# Patient Record
Sex: Female | Born: 1960 | Race: White | Hispanic: No | Marital: Married | Smoking: Former smoker
Health system: Southern US, Community
[De-identification: ages and names within clinical notes are randomized; demographics above are authoritative.]

## PROBLEM LIST (undated history)

## (undated) DIAGNOSIS — E7800 Pure hypercholesterolemia, unspecified: Secondary | ICD-10-CM

## (undated) DIAGNOSIS — E079 Disorder of thyroid, unspecified: Secondary | ICD-10-CM

## (undated) HISTORY — DX: Disorder of thyroid, unspecified: E07.9

## (undated) HISTORY — DX: Pure hypercholesterolemia, unspecified: E78.00

---

## 2016-04-22 ENCOUNTER — Ambulatory Visit (INDEPENDENT_AMBULATORY_CARE_PROVIDER_SITE_OTHER): Payer: BLUE CROSS/BLUE SHIELD | Admitting: Orthopaedic Surgery

## 2016-04-22 ENCOUNTER — Encounter (INDEPENDENT_AMBULATORY_CARE_PROVIDER_SITE_OTHER): Payer: Self-pay | Admitting: Orthopaedic Surgery

## 2016-04-22 ENCOUNTER — Ambulatory Visit (INDEPENDENT_AMBULATORY_CARE_PROVIDER_SITE_OTHER): Payer: Self-pay

## 2016-04-22 VITALS — BP 139/93 | HR 91 | Ht 67.0 in | Wt 215.0 lb

## 2016-04-22 DIAGNOSIS — M5442 Lumbago with sciatica, left side: Secondary | ICD-10-CM | POA: Diagnosis not present

## 2016-04-22 DIAGNOSIS — M5441 Lumbago with sciatica, right side: Secondary | ICD-10-CM

## 2016-04-22 NOTE — Progress Notes (Signed)
Office Visit Note   Patient: Kayla Esparza           Date of Birth: 02/07/1961           MRN: [id number] Visit Date: 04/22/2016              Requested by: Ardyth GalJane Ybanez, MD [address] [place], [place] [address] PCP: Ardyth GalYBANEZ,JANE, MD   Assessment & Plan: Visit Diagnoses:  1. Bilateral low back pain with bilateral sciatica, unspecified chronicity     Plan: We'll proceed with physical therapy I'll recheck her in a month. She has ongoing symptoms left consider diagnostic imaging. Her pain has gradually progressed over the years and is getting to the point where she can't sleep in a bed asleep on the sofa bothers her with traveling which is part of her husband's job.  Follow-Up Instructions: Return in about 1 month (around 05/23/2016).   Orders:  Orders Placed This Encounter  Procedures  . XR Lumbar Spine 2-3 Views   No orders of the defined types were placed in this encounter.     Procedures: No procedures performed   Clinical Data: No additional findings.   Subjective: Chief Complaint  Patient presents with  . Lower Back - Pain    Kayla Esparza is here for back pain which she has had for many years. Recently it has began to interfere with her daily activities. Sometimes she has pain is down front or on the side of legs. Unable to stand for long periods of time.  Patient has problems with that being in a car more than 2030 minutes. 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 She's been having to sleep on the couch can't sleep on the bed she has extreme sharp pain when she turns over that wakes her up. She's been using supratherapeutic dosages of ibuprofen which she knows is not good taking 2400 mg daily. She denies fever chills no well-balanced bladder symptoms.  Review of Systems  Constitutional: Negative for chills and diaphoresis.  HENT: Negative for ear discharge, ear pain and nosebleeds.   Eyes: Negative  for discharge and visual disturbance.  Respiratory: Negative for cough, choking and shortness of breath.   Cardiovascular: Negative for chest pain and palpitations.  Gastrointestinal: Negative for abdominal distention and abdominal pain.  Endocrine: Negative for cold intolerance and heat intolerance.  Genitourinary: Negative for flank pain and hematuria.  Musculoskeletal: Positive for back pain.       Chronic back pain off and on since age 55. Worse in the last several years. Bilateral knee pain with crepitus  Skin: Negative for rash and wound.  Neurological: Negative for seizures and speech difficulty.  Hematological: Negative for adenopathy. Does not bruise/bleed easily.  Psychiatric/Behavioral: Negative for agitation and suicidal ideas.     Objective: Vital Signs: BP (!) 139/93 (BP Location: Left Arm, Patient Position: Sitting)   Pulse 91   Ht 5\' 7"  (1.702 m)   Wt 215 lb (97.5 kg)   BMI 33.67 kg/m   Physical Exam  Constitutional: She is oriented to person, place, and time. She appears well-developed.  HENT:  Head: Normocephalic.  Right Ear: External ear normal.  Left Ear: External ear normal.  Eyes: Pupils are equal, round, and reactive to light.  Neck: No tracheal deviation present. No thyromegaly present.  Cardiovascular: Normal rate.   Pulmonary/Chest: Effort normal.  Abdominal: Soft.  Musculoskeletal:  Negative logroll the hips negative straight  leg raising 90. Bilateral knee crepitus. Full extension and flexion past 120. Collateral ligaments are stable no varus or valgus deformity. No synovitis upper extremities elbows fingers the wrist.  Neurological: She is alert and oriented to person, place, and time.  Skin: Skin is warm and dry.  Psychiatric: She has a normal mood and affect. Her behavior is normal.    Ortho Exam normal quad strength good hip flexion. Knee and ankle jerk are 2+ and symmetrical. Anterior tib EHL is strong with good resistance normal heel and toe  walking. There is bilateral sciatic notch tenderness bilateral trochanteric bursal region tenderness. Most of the pain radiates to the upper outer buttocks and into the trochanter. Rarely does she has pain that radiates past the the knees. A negative Pearlean BrownieFaber test  Specialty Comments:  No specialty comments available.  Imaging: Xr Lumbar Spine 2-3 Views  Result Date: 04/22/2016 Through 3 view x-rays lumbar spine obtained. Patient has no spondylolisthesis. A lateral x-ray there is narrowing of L5-S1. L4-5 space is normal there some narrowing at L2-3 with endplate changes and mild narrowing at L3-4. Hips SI joints are normal. No pars defects. 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    PMFS History: There are no active problems to display for this patient.  No past medical history on file.  No family history on file.  No past surgical history on file. Social History   Occupational History  . Not on file.   Social History Main Topics  . Smoking status: Former Games developermoker  . Smokeless tobacco: Never Used  . Alcohol use Not on file  . Drug use: Unknown  . Sexual activity: Not on file

## 2016-04-22 NOTE — Patient Instructions (Signed)
2. Do not take more than 1600 mg of ibuprofen per day. You can try some extra strength Tylenol 2 by mouth twice a day. Physical therapy ligament been ordered return in one month see Dr. Ophelia CharterYates

## 2016-05-11 ENCOUNTER — Ambulatory Visit: Payer: BLUE CROSS/BLUE SHIELD | Attending: Orthopaedic Surgery | Admitting: Physical Therapy

## 2016-05-11 DIAGNOSIS — G8929 Other chronic pain: Secondary | ICD-10-CM

## 2016-05-11 DIAGNOSIS — R293 Abnormal posture: Secondary | ICD-10-CM | POA: Insufficient documentation

## 2016-05-11 DIAGNOSIS — M5442 Lumbago with sciatica, left side: Secondary | ICD-10-CM | POA: Insufficient documentation

## 2016-05-11 DIAGNOSIS — M5441 Lumbago with sciatica, right side: Secondary | ICD-10-CM | POA: Insufficient documentation

## 2016-05-11 NOTE — Patient Instructions (Signed)
Pelvic Tilt: Posterior - Legs Bent (Supine)    Tighten stomach and flatten back by rolling pelvis down. Hold __5-10__ seconds. Relax. Repeat __15__ times per set.  Do __2-3__ sessions per day.    Single knee to chest - Double knee to chest  10-30 seconds - 5 times

## 2016-05-11 NOTE — Therapy (Signed)
[facility] [address]  [address] [place], [place], [address] Phone: [phone]   Fax:  [phone]  Physical Therapy Evaluation  Patient Details  Name: Kayla Esparza MRN: [id number] Date of Birth: 01-10-1961 Referring Provider: Dr. Annell Greening  Encounter Date: 05/11/2016      PT End of Session - 05/11/16 1749    Visit Number 1   Number of Visits 16   Date for PT Re-Evaluation 07/06/16   PT Start Time 1447   PT Stop Time 1540   PT Time Calculation (min) 53 min   Activity Tolerance Patient tolerated treatment well;Patient limited by pain   Behavior During Therapy [facility] for tasks assessed/performed      No past medical history on file.  No past surgical history on file.  There were no vitals filed for this visit.       Subjective Assessment - 05/11/16 1449    Subjective Patient reporting chronic low back pain - approx 20 years. Recently pain has been interferring with daily functions and sleeping. Has difficulty sleeping in one position as well as turning in bed. Patient reports she tends to push herself through pain. Patient does a good bit of physical activity with husbands work. Patient reports that she does not want to take pain medication and would like solve problem. Pronlonged standing and standing activities (sweeping, cooking, making a bed) seem to aggravate pain. Has been taking up to 2400 mg ibuprofen. Does not use ice/heat. Patient reports episodes of incontinence - bladder. Does have intermittent pain into B LE.    Limitations Sitting;Standing;Walking   How long can you sit comfortably? straight - at least 1 hour   How long can you stand comfortably? 10 minutes   How long can you walk comfortably? 30 minutes   Diagnostic tests xray - "narrowing discs"    Patient Stated Goals reduce back pain, avoid surgery, not take pain medication   Currently in Pain? Yes   Pain Score 0-No pain  average daily pain: 7-8/10    Pain Location Back   Pain Orientation Lower;Right;Left   Pain Descriptors / Indicators Burning   Pain Type Chronic pain   Pain Onset More than a month ago   Pain Frequency Intermittent   Aggravating Factors  prolonged standing, leaning back to sit   Pain Relieving Factors sitting (straight and on firm surface)            [facility] PT Assessment - 05/11/16 1458      Assessment   Medical Diagnosis Bilateral low back pain with bilateral sciatica   Referring Provider Dr. Annell Greening   Onset Date/Surgical Date --  many years ago   Next MD Visit prn   Prior Therapy no     Balance Screen   Has the patient fallen in the past 6 months No   Has the patient had a decrease in activity level because of a fear of falling?  No   Is the patient reluctant to leave their home because of a fear of falling?  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, travelling   Leisure travelling     Cognition   Overall Cognitive Status Within Functional Limits for tasks assessed  Observation/Other Assessments   Focus on Therapeutic Outcomes (FOTO)  Lumbar Spine: 37 (63% limited, predicted 47% limited)     Posture/Postural Control   Posture/Postural Control Postural limitations   Postural Limitations Rounded Shoulders;Forward head;Increased lumbar lordosis     ROM / Strength   AROM / PROM / Strength AROM;Strength     AROM   AROM Assessment Site Lumbar   Lumbar Flexion fingertip to anterior ankle - no pain   Lumbar Extension 75% limited - painful   Lumbar - Right Side Bend fingertip to jointline   Lumbar - Left Side Bend fincgertip to jointline - painful   Lumbar - Right Rotation 50% limited   Lumbar - Left Rotation 50% limited - painful     Strength   Strength Assessment  Site Hip;Knee   Right/Left Hip Right;Left   Right Hip Flexion 4/5   Right Hip Extension 3+/5   Right Hip ABduction 4-/5   Right Hip ADduction 4/5   Left Hip Flexion 4/5   Left Hip Extension 3/5   Left Hip ABduction 3+/5   Left Hip ADduction 4-/5   Right/Left Knee Right;Left   Right Knee Flexion 5/5   Right Knee Extension 5/5   Left Knee Flexion 5/5   Left Knee Extension 5/5     Flexibility   Soft Tissue Assessment /Muscle Length yes   Hamstrings B tightness - pain in L lumbar with B passive movements     Palpation   Palpation comment tenderness to palpation along B glute region, B lumbar paraspinals. Tender with light CPA of scarum and lumbar spinal segments     Special Tests    Special Tests Lumbar   Lumbar Tests Straight Leg Raise     Straight Leg Raise   Findings Negative   Side  --  bilateral                   [facility] Adult PT Treatment/Exercise - 05/11/16 1458      Exercises   Exercises Lumbar     Lumbar Exercises: Stretches   Single Knee to Chest Stretch 2 reps;10 seconds     Lumbar Exercises: Supine   Ab Set 5 reps;5 seconds     Modalities   Modalities Traction     Traction   Type of Traction Lumbar   Min (lbs) 110   Max (lbs) 120   Hold Time 60   Rest Time 20   Time 15                PT Education - 05/11/16 1748    Education provided Yes   Education Details exam findings, POC, HEP   Person(s) Educated Patient   Methods Explanation;Demonstration;Handout   Comprehension Verbalized understanding;Returned demonstration;Need further instruction             PT Long Term Goals - 05/11/16 1803      PT LONG TERM GOAL #1   Title patient to be independent with HEP (07/06/16)   Status New     PT LONG TERM GOAL #2   Title Patient to improve lumbar AROM to WNL in all directions with pain no greater than 2/10 (07/06/16)   Status New     PT LONG TERM GOAL #3   Title Patient to verbalize and demonstrate proper body mechanics for  reduced stress placed on low back for improved pain levels (07/06/16)   Status New     PT LONG TERM GOAL #4   Title Patient to demonstrate  proper lifting mechanics for reduced risk for injury to low back (07/06/16)   Status New     PT LONG TERM GOAL #5   Title Patient to report ability to roll in bed/perform transfers with pain no greater than 2/10 for greater than 2 weeks (07/06/16)   Status New     Additional Long Term Goals   Additional Long Term Goals Yes     PT LONG TERM GOAL #6   Title patient to improve B hip strength to >/= 4+/5 (07/06/16)   Status New               Plan - 05/11/16 1749    Clinical Impression Statement Patient is a 56 y/o female presenting to OPPT today for moderate complexity evaluation with patients primary complaints regarding chronic low back pain with intermittent pain radiation into B LE. Patient today with no pain while sitting, however pain exacerbated while lying supine as well as supine to sit transfer with patient becoming tearful due to pain. Patient with slight decreased in B hip strength with abduction and hip extension demonstrating the most deficits, tenderness to palpation along glute complex and lumbar paraspinals, pain with lumbar extension, left lateral flexion and rotation as well as reduced core engagement likely increasing stresses placed on lumbar structures. Discussion with patient regarding plan of care, goal establishment as well as HEP with patient in full agreement to treatment plan. Lumbar traction initiated today with max level between 50-60% of patients body weight with patient reporting pain levels of 1/10 during treatement; which may have been caused from supine position, however pain slightly increased when traction forces were reduced to 0 lbs and pain exacerbated when sitting up from treatment table. Patient to benefit from PT to address the above listed deficits to maximize function and improve quality of life.    Rehab Potential  Good   PT Frequency 2x / week   PT Duration 8 weeks   PT Treatment/Interventions ADLs/Self Care Home Management;Cryotherapy;Electrical Stimulation;Moist Heat;Traction;Ultrasound;Neuromuscular re-education;Therapeutic exercise;Therapeutic activities;Functional mobility training;Patient/family education;Manual techniques;Passive range of motion;Taping;Dry needling   PT Next Visit Plan core stabilization; modalites PRN for pain   Consulted and Agree with Plan of Care Patient      Patient will benefit from skilled therapeutic intervention in order to improve the following deficits and impairments:  Pain, Decreased activity tolerance, Decreased range of motion, Decreased mobility, Decreased strength, Increased muscle spasms  Visit Diagnosis: Chronic bilateral low back pain with bilateral sciatica - Plan: PT plan of care cert/re-cert  Abnormal posture - Plan: PT plan of care cert/re-cert     Problem List There are no active problems to display for this patient.   Kipp Laurence, PT, DPT 05/11/16 6:09 PM   [facility] [address]  [address] [place], [place], [address] Phone: [phone]   Fax:  [phone]  Name: Kayla Esparza MRN: [id number] Date of Birth: November 17, 1960

## 2016-05-12 ENCOUNTER — Ambulatory Visit: Payer: BLUE CROSS/BLUE SHIELD | Admitting: Physical Therapy

## 2016-05-12 DIAGNOSIS — R293 Abnormal posture: Secondary | ICD-10-CM

## 2016-05-12 DIAGNOSIS — G8929 Other chronic pain: Secondary | ICD-10-CM

## 2016-05-12 DIAGNOSIS — M5442 Lumbago with sciatica, left side: Principal | ICD-10-CM

## 2016-05-12 DIAGNOSIS — M5441 Lumbago with sciatica, right side: Principal | ICD-10-CM

## 2016-05-12 NOTE — Patient Instructions (Signed)
Piriformis Stretch    Lying on back, pull right knee toward opposite shoulder. Hold __30__ seconds. Repeat __3__ times. Do __2__ sessions per day.  Hamstring Step 2    Left foot relaxed, knee straight, other leg bent, foot flat. Raise straight leg further upward to maximal range. Hold _30__ seconds. Relax leg completely down. Repeat _3__ times.

## 2016-05-12 NOTE — Therapy (Signed)
[facility] [place] [address]  [address] [place], [place], [address] Phone: [phone]   Fax:  [phone]  Physical Therapy Treatment  Patient Details  Name: Kayla Esparza MRN: [id number] Date of Birth: [birth year] Referring Provider: Dr. Annell GreeningMark Yates  Encounter Date: 05/12/2016      PT End of Session - 05/12/16 0844    Visit Number 2   Number of Visits 16   Date for PT Re-Evaluation 07/06/16   PT Start Time 0842   PT Stop Time 0928   PT Time Calculation (min) 46 min   Activity Tolerance Patient tolerated treatment well;Patient limited by pain   Behavior During Therapy [facility] for tasks assessed/performed      No past medical history on file.  No past surgical history on file.  There were no vitals filed for this visit.      Subjective Assessment - 05/12/16 0843    Subjective tried log rolling to get out of bed this moring - had a little less pain with this.    Diagnostic tests xray - "narrowing discs"    Patient Stated Goals reduce back pain, avoid surgery, not take pain medication   Currently in Pain? No/denies   Pain Score 0-No pain  7/10 when first got up this moring   Pain Location Back   Pain Orientation Right;Left;Lower   Pain Type Chronic pain   Pain Onset More than a month ago   Pain Frequency Intermittent            [facility] PT Assessment - 05/11/16 1458      Assessment   Medical Diagnosis Bilateral low back pain with bilateral sciatica   Referring Provider Dr. Annell GreeningMark Yates   Onset Date/Surgical Date --  many years ago   Next MD Visit prn   Prior Therapy no     Balance Screen   Has the patient fallen in the past 6 months No   Has the patient had a decrease in activity level because of a fear of falling?  No   Is the patient reluctant to leave their home because of a fear of falling?  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, travelling   Leisure travelling     Cognition   Overall Cognitive Status Within Functional Limits for tasks assessed     Observation/Other Assessments   Focus on Therapeutic Outcomes (FOTO)  Lumbar Spine: 37 (63% limited, predicted 47% limited)     Posture/Postural Control   Posture/Postural Control Postural limitations   Postural Limitations Rounded Shoulders;Forward head;Increased lumbar lordosis     ROM / Strength   AROM / PROM / Strength AROM;Strength     AROM   AROM Assessment Site Lumbar   Lumbar Flexion fingertip to anterior ankle - no pain   Lumbar Extension 75% limited - painful   Lumbar - Right Side Bend fingertip to jointline   Lumbar - Left Side Bend fincgertip to jointline - painful   Lumbar - Right Rotation 50% limited   Lumbar - Left Rotation 50% limited - painful     Strength   Strength Assessment Site Hip;Knee   Right/Left Hip Right;Left   Right Hip Flexion 4/5   Right Hip Extension 3+/5   Right  Hip ABduction 4-/5   Right Hip ADduction 4/5   Left Hip Flexion 4/5   Left Hip Extension 3/5   Left Hip ABduction 3+/5   Left Hip ADduction 4-/5   Right/Left Knee Right;Left   Right Knee Flexion 5/5   Right Knee Extension 5/5   Left Knee Flexion 5/5   Left Knee Extension 5/5     Flexibility   Soft Tissue Assessment /Muscle Length yes   Hamstrings B tightness - pain in L lumbar with B passive movements     Palpation   Palpation comment tenderness to palpation along B glute region, B lumbar paraspinals. Tender with light CPA of scarum and lumbar spinal segments     Special Tests    Special Tests Lumbar   Lumbar Tests Straight Leg Raise     Straight Leg Raise   Findings Negative   Side  --  bilateral                     [facility] Adult PT Treatment/Exercise - 05/12/16 0001      Lumbar Exercises:  Stretches   Passive Hamstring Stretch 3 reps;30 seconds   Passive Hamstring Stretch Limitations B LE   Single Knee to Chest Stretch Limitations HEP review   Quadruped Mid Back Stretch Limitations seated on mat table - red physioball for lumbar flexion/right side stretch/left side stretch - 5 reps each direction for 5 second hold   ITB Stretch 2 reps;20 seconds   ITB Stretch Limitations attempted - terminated due to medial groin pain   Piriformis Stretch 3 reps;30 seconds   Piriformis Stretch Limitations B LE      Lumbar Exercises: Aerobic   Stationary Bike Nustep Level 5 x 5 minutes     Lumbar Exercises: Supine   Ab Set 15 reps;5 seconds   Clam 10 reps   Clam Limitations B LE - ab set prior to movement   Bridge 15 reps   Straight Leg Raise 15 reps   Straight Leg Raises Limitations B LE                 PT Education - 05/11/16 1748    Education provided Yes   Education Details exam findings, POC, HEP   Person(s) Educated Patient   Methods Explanation;Demonstration;Handout   Comprehension Verbalized understanding;Returned demonstration;Need further instruction             PT Long Term Goals - 05/11/16 1803      PT LONG TERM GOAL #1   Title patient to be independent with HEP (07/06/16)   Status New     PT LONG TERM GOAL #2   Title Patient to improve lumbar AROM to WNL in all directions with pain no greater than 2/10 (07/06/16)   Status New     PT LONG TERM GOAL #3   Title Patient to verbalize and demonstrate proper body mechanics for reduced stress placed on low back for improved pain levels (07/06/16)   Status New     PT LONG TERM GOAL #4   Title Patient to demonstrate proper lifting mechanics for reduced risk for injury to low back (07/06/16)   Status New     PT LONG TERM GOAL #5   Title Patient to report ability to roll in bed/perform transfers with pain no greater than 2/10 for greater than 2 weeks (07/06/16)   Status New     Additional Long Term Goals    Additional Long Term Goals Yes  PT LONG TERM GOAL #6   Title patient to improve B hip strength to >/= 4+/5 (07/06/16)   Status New               Plan - 05/12/16 0844    Clinical Impression Statement Patient doing well today - no pain at beginning of session, however awoke with pain up to 7/10 this morning. Did try log rolling to get out of bed this morning with reduced pain as compared to normal. Review of log rolling techniques in session today from both right and left sides to reinforce correct transfer technique with very little pain experience during task. Core stabilization reviewed and completed with some difficulty with this as patient tends to use B LE rather than core musculature - some pain experienced when coming out of posterior pelvic tilt posture. Gentle strengthening and stretching of hips initiated today with good form. Patient to continue to benefit from PT to maximize function.    PT Treatment/Interventions ADLs/Self Care Home Management;Cryotherapy;Electrical Stimulation;Moist Heat;Traction;Ultrasound;Neuromuscular re-education;Therapeutic exercise;Therapeutic activities;Functional mobility training;Patient/family education;Manual techniques;Passive range of motion;Taping;Dry needling   PT Next Visit Plan core stabilization; modalites PRN for pain   Consulted and Agree with Plan of Care Patient      Patient will benefit from skilled therapeutic intervention in order to improve the following deficits and impairments:  Pain, Decreased activity tolerance, Decreased range of motion, Decreased mobility, Decreased strength, Increased muscle spasms  Visit Diagnosis: Chronic bilateral low back pain with bilateral sciatica  Abnormal posture     Problem List There are no active problems to display for this patient.   Kipp Laurence, PT, DPT 05/12/16 11:17 AM   [facility] [address]  [address] [place], [place], [address] Phone: [phone]   Fax:  [phone]  Name: Kayla Esparza MRN: [id number] Date of Birth: 1960/12/08

## 2016-05-13 ENCOUNTER — Ambulatory Visit: Payer: Self-pay | Admitting: Physical Therapy

## 2016-05-20 ENCOUNTER — Ambulatory Visit: Payer: BLUE CROSS/BLUE SHIELD | Admitting: Physical Therapy

## 2016-05-24 ENCOUNTER — Ambulatory Visit: Payer: BLUE CROSS/BLUE SHIELD

## 2016-05-24 DIAGNOSIS — M5442 Lumbago with sciatica, left side: Secondary | ICD-10-CM | POA: Diagnosis not present

## 2016-05-24 DIAGNOSIS — M5441 Lumbago with sciatica, right side: Principal | ICD-10-CM

## 2016-05-24 DIAGNOSIS — R293 Abnormal posture: Secondary | ICD-10-CM

## 2016-05-24 DIAGNOSIS — G8929 Other chronic pain: Secondary | ICD-10-CM

## 2016-05-24 NOTE — Therapy (Addendum)
[facility] [address]  [place], [place], [address] Phone: [phone]   Fax:  [phone]  Physical Therapy Treatment  Patient Details  Name: Kayla Esparza MRN: [id number] Date of Birth: October 31, 1960 Referring Provider: Dr. Rodell Perna  Encounter Date: 05/24/2016      PT End of Session - 05/24/16 1315    Visit Number 3   Number of Visits 16   Date for PT Re-Evaluation 07/06/16   PT Start Time 1311   PT Stop Time 1415   PT Time Calculation (min) 64 min   Activity Tolerance Patient tolerated treatment well;Patient limited by pain   Behavior During Therapy [facility] for tasks assessed/performed      No past medical history on file.  No past surgical history on file.  There were no vitals filed for this visit.      Subjective Assessment - 05/24/16 1313    Subjective Pt. reporting LBP is mostly R sided today.  Pt. reporting nothing makes the pain better at this point.     Patient Stated Goals reduce back pain, avoid surgery, not take pain medication   Pain Score 5    Pain Location Back   Pain Orientation Right;Lower   Pain Descriptors / Indicators Shooting   Pain Type Chronic pain   Pain Onset More than a month ago   Multiple Pain Sites No            [facility] Adult PT Treatment/Exercise - 05/24/16 1325      Lumbar Exercises: Stretches   Passive Hamstring Stretch 3 reps;30 seconds   Passive Hamstring Stretch Limitations B LE   Single Knee to Chest Stretch 2 reps;30 seconds   Single Knee to Chest Stretch Limitations SKTC stretch 2 x 30 sec    Piriformis Stretch 3 reps;30 seconds   Piriformis Stretch Limitations B LE      Lumbar Exercises: Aerobic   Stationary Bike Nustep Level 5 x 6 minutes     Lumbar Exercises: Machines for Strengthening   Other Lumbar Machine Exercise BATCA low row 15# x 15 reps  cues provided for upright posture     Lumbar Exercises: Standing   Other Standing Lumbar Exercises  Functional squat with TRX x 10 reps; cues provided for upright posture   Other Standing Lumbar Exercises Functional squat x 10 reps on counter     Lumbar Exercises: Supine   Ab Set 15 reps;5 seconds  2 sets    Clam 10 reps   Clam Limitations abdom. brace with alternating hip/ER with green TB each side   Bent Knee Raise 10 reps;2 seconds   Bent Knee Raise Limitations with abdominal brace. with green TB    Bridge 15 reps;2 seconds  2 sets    Other Supine Lumbar Exercises Supine HS curl with heels on peanut p-ball x 10 reps   Other Supine Lumbar Exercises Supine SL bridge with heels on peanut p-ball 2" x 10 reps     Lumbar Exercises: Sidelying   Clam 10 reps;3 seconds   Clam Limitations Bilaterally; with green TB around knees     Modalities   Modalities Traction     Traction   Type of Traction Lumbar   Min (lbs) 65   Max (lbs) 50   Hold Time 60   Rest Time 20   Time 15            PT Long Term Goals - 05/24/16 1316  PT LONG TERM GOAL #1   Title patient to be independent with HEP (07/06/16)   Status On-going     PT LONG TERM GOAL #2   Title Patient to improve lumbar AROM to WNL in all directions with pain no greater than 2/10 (07/06/16)   Status On-going     PT LONG TERM GOAL #3   Title Patient to verbalize and demonstrate proper body mechanics for reduced stress placed on low back for improved pain levels (07/06/16)   Status On-going     PT LONG TERM GOAL #4   Title Patient to demonstrate proper lifting mechanics for reduced risk for injury to low back (07/06/16)   Status On-going     PT LONG TERM GOAL #5   Title Patient to report ability to roll in bed/perform transfers with pain no greater than 2/10 for greater than 2 weeks (07/06/16)   Status On-going     PT LONG TERM GOAL #6   Title patient to improve B hip strength to >/= 4+/5 (07/06/16)   Status On-going               Plan - 05/24/16 1317    Clinical Impression Statement Pt. reporting she feels  that therapy is not helping and she has an MD f/u tomorrow where she anticipates being cleared to schedule an MRI.  Today's treatment focused on lumbopelvic strengthening and stretching to pt. tolerance.  Pt. LBP decreasing to 0/10 following supine therex however rising to 5/10 following lumbar traction today.  Conservative setting performed with lumbar traction 65#/50# in hooklying, neutral pull, however pt. with increased pain with transitional movements following traction.  Pt. instructed to inform therapist next treatment of ongoing response to traction.   PT Treatment/Interventions ADLs/Self Care Home Management;Cryotherapy;Electrical Stimulation;Moist Heat;Traction;Ultrasound;Neuromuscular re-education;Therapeutic exercise;Therapeutic activities;Functional mobility training;Patient/family education;Manual techniques;Passive range of motion;Taping;Dry needling   PT Next Visit Plan core stabilization; modalites PRN for pain      Patient will benefit from skilled therapeutic intervention in order to improve the following deficits and impairments:  Pain, Decreased activity tolerance, Decreased range of motion, Decreased mobility, Decreased strength, Increased muscle spasms  Visit Diagnosis: Chronic bilateral low back pain with bilateral sciatica  Abnormal posture     Problem List There are no active problems to display for this patient.   Bess Harvest, PTA 05/24/16 6:27 PM    PHYSICAL THERAPY DISCHARGE SUMMARY  Visits from Start of Care: 3  Current functional level related to goals / functional outcomes: See above   Remaining deficits: See above; pain, reduced activity tolerance   Education / Equipment: HEP  Plan: Patient agrees to discharge.  Patient goals were not met. Patient is being discharged due to not returning since the last visit.  ?????    Patient not returning since last visit. Did follow-up with MD with MRI completed per chart review.   Lanney Gins, PT,  DPT 07/15/16 4:15 PM   [facility] [address]  [place], [place], [address] Phone: [phone]   Fax:  [phone]  Name: Kayla Esparza MRN: [id number] Date of Birth: [birth year]

## 2016-05-25 ENCOUNTER — Ambulatory Visit (INDEPENDENT_AMBULATORY_CARE_PROVIDER_SITE_OTHER): Payer: BLUE CROSS/BLUE SHIELD | Admitting: Orthopaedic Surgery

## 2016-05-25 ENCOUNTER — Encounter (INDEPENDENT_AMBULATORY_CARE_PROVIDER_SITE_OTHER): Payer: Self-pay | Admitting: Orthopaedic Surgery

## 2016-05-25 VITALS — BP 140/90 | HR 77 | Ht 67.0 in | Wt 215.0 lb

## 2016-05-25 DIAGNOSIS — G8929 Other chronic pain: Secondary | ICD-10-CM

## 2016-05-25 DIAGNOSIS — M545 Low back pain, unspecified: Secondary | ICD-10-CM

## 2016-05-25 NOTE — Addendum Note (Signed)
Addended by: Rogers SeedsYEATTS, Arieona Swaggerty M on: 05/25/2016 09:24 AM   Modules accepted: Orders

## 2016-05-25 NOTE — Progress Notes (Signed)
Office Visit Note   Patient: Kayla Esparza           Date of Birth: [birth year]           MRN: [id number] Visit Date: 05/25/2016              Requested by: Ardyth GalJane Ybanez, MD [address] [place], [place] [address] PCP: Ardyth GalYBANEZ,JANE, MD   Assessment & Plan: Visit Diagnoses:  1. Chronic bilateral low back pain without sciatica     Plan: We'll proceed with a lumbar MRI scan imaging. She's not gotten any relief physical therapy. She thinks the therapy may have actually made things worse she has pain on a daily basis she wakes up at 4 the morning sometimes can go back to sleep just get up and walk around she has pain when she turns over pain with bending and twisting pain radiates from her back into her left leg down to her knee. She's actually gotten a little worse with therapy will obtain an MRI scan. Plain radiograph showed several levels that showed disc space narrowing with some facet changes. Office follow-up after MRI scan for review. Follow-Up Instructions: No Follow-up on file.   Orders:  No orders of the defined types were placed in this encounter.  No orders of the defined types were placed in this encounter.     Procedures: No procedures performed   Clinical Data: No additional findings.   Subjective: Chief Complaint  Patient presents with  . Lower Back - Pain    Patient returns for one month follow up low back pain. She has been going to physical therapy but feels that is making her back worse. She has fallen after getting up since her last visit due to the pain that she has in her back.  She does have radiculopathy on the left. The pain runs down the lateral side of her left leg to just below her knee. She also has some radiculopathy on the right, stating the pain goes down the front of her right leg. She denies taking anything for pain.    Review of Systems  Constitutional: Negative for chills and diaphoresis.  HENT: Negative for ear discharge, ear  pain and nosebleeds.   Eyes: Negative for discharge and visual disturbance.  Respiratory: Negative for cough, choking and shortness of breath.   Cardiovascular: Negative for chest pain and palpitations.  Gastrointestinal: Negative for abdominal distention and abdominal pain.  Endocrine: Negative for cold intolerance and heat intolerance.  Genitourinary: Negative for flank pain and hematuria.  Musculoskeletal:       Chronic back pain since age 56.  Skin: Negative for rash and wound.  Neurological: Negative for seizures and speech difficulty.  Hematological: Negative for adenopathy. Does not bruise/bleed easily.  Psychiatric/Behavioral: Negative for agitation and suicidal ideas.     Objective: Vital Signs: BP 140/90   Pulse 77   Ht 5\' 7"  (1.702 m)   Wt 215 lb (97.5 kg)   BMI 33.67 kg/m   Physical Exam  Constitutional: She is oriented to person, place, and time. She appears well-developed.  HENT:  Head: Normocephalic.  Right Ear: External ear normal.  Left Ear: External ear normal.  Eyes: Pupils are equal, round, and reactive to light.  Neck: No tracheal deviation present. No thyromegaly present.  Cardiovascular: Normal rate.   Pulmonary/Chest: Effort normal.  Abdominal: Soft.  Musculoskeletal:  Patient's able heel and toe walk she has difficulty getting in a fully erect position tends to  walk with the hip slightly flexed forward. Anterior tib EHL is intact no pitting edema distal pulses are intact no pain with hip range of motion. She has tenderness of the greater trochanter sciatic notch tenderness worse on the left than right. Pain radiates from her back into the buttocks stop some of the knee. No venous stasis changes. Strength ankle dorsiflexion plantar flexion is normal good quad strength good symmetry.  Neurological: She is alert and oriented to person, place, and time.  Skin: Skin is warm and dry.  Psychiatric: She has a normal mood and affect. Her behavior is normal.     Ortho Exam  Specialty Comments:  No specialty comments available.  Imaging: No results found.   PMFS History: There are no active problems to display for this patient.  No past medical history on file.  No family history on file.  No past surgical history on file. Social History   Occupational History  . Not on file.   Social History Main Topics  . Smoking status: Former Games developer  . Smokeless tobacco: Never Used  . Alcohol use Not on file  . Drug use: Unknown  . Sexual activity: Not on file

## 2016-05-26 ENCOUNTER — Ambulatory Visit: Payer: BLUE CROSS/BLUE SHIELD | Admitting: Physical Therapy

## 2016-06-01 ENCOUNTER — Ambulatory Visit: Payer: BLUE CROSS/BLUE SHIELD

## 2016-06-03 ENCOUNTER — Ambulatory Visit: Payer: BLUE CROSS/BLUE SHIELD

## 2016-06-08 ENCOUNTER — Ambulatory Visit: Payer: BLUE CROSS/BLUE SHIELD | Admitting: Physical Therapy

## 2016-06-10 ENCOUNTER — Ambulatory Visit: Payer: BLUE CROSS/BLUE SHIELD | Admitting: Physical Therapy

## 2016-06-10 ENCOUNTER — Ambulatory Visit
Admission: RE | Admit: 2016-06-10 | Discharge: 2016-06-10 | Disposition: A | Discharge status: Home / Self Care | Payer: BLUE CROSS/BLUE SHIELD | Source: Ambulatory Visit | Attending: Orthopaedic Surgery | Admitting: Orthopaedic Surgery

## 2016-06-10 DIAGNOSIS — M545 Low back pain, unspecified: Secondary | ICD-10-CM

## 2016-06-10 DIAGNOSIS — G8929 Other chronic pain: Secondary | ICD-10-CM

## 2016-06-14 ENCOUNTER — Ambulatory Visit (INDEPENDENT_AMBULATORY_CARE_PROVIDER_SITE_OTHER): Payer: BLUE CROSS/BLUE SHIELD | Admitting: Orthopaedic Surgery

## 2016-06-16 ENCOUNTER — Ambulatory Visit (INDEPENDENT_AMBULATORY_CARE_PROVIDER_SITE_OTHER): Payer: BLUE CROSS/BLUE SHIELD | Admitting: Orthopaedic Surgery

## 2016-07-23 ENCOUNTER — Ambulatory Visit (INDEPENDENT_AMBULATORY_CARE_PROVIDER_SITE_OTHER): Payer: BLUE CROSS/BLUE SHIELD | Admitting: Orthopaedic Surgery

## 2016-07-23 ENCOUNTER — Encounter (INDEPENDENT_AMBULATORY_CARE_PROVIDER_SITE_OTHER): Payer: Self-pay | Admitting: Orthopaedic Surgery

## 2016-07-23 VITALS — BP 139/95 | HR 95 | Ht 67.0 in | Wt 215.0 lb

## 2016-07-23 DIAGNOSIS — M5136 Other intervertebral disc degeneration, lumbar region: Secondary | ICD-10-CM | POA: Diagnosis not present

## 2016-07-23 NOTE — Progress Notes (Signed)
Office Visit Note   Patient: Kayla Esparza           Date of Birth: [birth year]           MRN: [id number] Visit Date: 07/23/2016              Requested by: Ardyth GalJane Ybanez, MD [address] [place], [place] [address] PCP: Ardyth GalYBANEZ,JANE, MD   Assessment & Plan: Visit Diagnoses:  1. Other intervertebral disc degeneration, lumbar region     Plan: MRI scan shows facet degenerative changes as well as some endplate edema at U9-N25-S1. Lumbar facet changes at L4-5 as well as L5-S1. We will surface of physical therapy and recheck her back again in 4 weeks. Reviewed the MRI images I gave her a copy of the report.   Follow-Up Instructions: Return in about 4 weeks (around 08/20/2016).   Orders:  Orders Placed This Encounter  Procedures  . Ambulatory referral to Physical Medicine Rehab   No orders of the defined types were placed in this encounter.     Procedures: No procedures performed   Clinical Data: No additional findings.   Subjective: Chief Complaint  Patient presents with  . Lower Back - Follow-up    Patient returns today for follow up to review MRI lumbar spine.   Space been through physical therapy without relief. Pain wakes her up at night she has difficulty going back to sleep. Pain radiates down for back and her left leg stops near the knee. He seemed to evaluate her symptoms. I scan lumbar is been performed and is available for review. She states she's had pain since age 56 chronically.  Review of Systems  Constitutional: Negative for chills and diaphoresis.  HENT: Negative for ear discharge, ear pain and nosebleeds.   Eyes: Negative for discharge and visual disturbance.  Respiratory: Negative for cough, choking and shortness of breath.   Cardiovascular: Negative for chest pain and palpitations.  Gastrointestinal: Negative for abdominal distention and abdominal pain.  Endocrine: Negative for cold intolerance and heat intolerance.  Genitourinary: Negative for  flank pain and hematuria.  Musculoskeletal:       Positive chronic back pain since age 56. Negative for rheumatologic condition. She denies fever or chills.  Skin: Negative for rash and wound.  Neurological: Negative for seizures and speech difficulty.  Hematological: Negative for adenopathy. Does not bruise/bleed easily.  Psychiatric/Behavioral: Negative for agitation and suicidal ideas.     Objective: Vital Signs: BP (!) 139/95 (BP Location: Left Arm, Patient Position: Sitting)   Pulse 95   Ht 5\' 7"  (1.702 m)   Wt 215 lb (97.5 kg)   BMI 33.67 kg/m   Physical Exam  Constitutional: She is oriented to person, place, and time. She appears well-developed.  HENT:  Head: Normocephalic.  Right Ear: External ear normal.  Left Ear: External ear normal.  Eyes: Pupils are equal, round, and reactive to light.  Neck: No tracheal deviation present. No thyromegaly present.  Cardiovascular: Normal rate.   Pulmonary/Chest: Effort normal.  Abdominal: Soft.  Musculoskeletal:  Patient has some palpable tenderness lumbosacral spine more tenderness left lumbar region than right pain with rotation and lateral bending as well as for flexion. Extremity reflexes are 2+. No SI motor weakness anterior tib gastrocsoleus, EHL, quads or hamstrings. Normal internal/external rotation both hips without pain. Distal pulses are intact. Slight decreased sensation in her feet from the ankles down no single dermatome sensory deficit. Knees have full range of motion good stability.  Neurological:  She is alert and oriented to person, place, and time.  Skin: Skin is warm and dry.  Psychiatric: She has a normal mood and affect. Her behavior is normal.    Ortho Exam  Specialty Comments:  No specialty comments available.  Imaging: Show images for MR Lumbar Spine w/o contrast  Study Result   CLINICAL DATA:  Low back pain.  Bilateral hip and leg pain.  EXAM: MRI LUMBAR SPINE WITHOUT  CONTRAST  TECHNIQUE: Multiplanar, multisequence MR imaging of the lumbar spine was performed. No intravenous contrast was administered.  COMPARISON:  04/22/2016  FINDINGS: Segmentation: The lowest lumbar type non-rib-bearing vertebra is labeled as L5.  Alignment:  No vertebral subluxation is observed.  Vertebrae: Type 1 degenerative endplate findings at L5-S1 with a broad Schmorl's node along the inferior endplate of L5.  Mild degenerative facet edema bilaterally at L4-5 and L5-S1. Hemangioma eccentric to the left in the S1 vertebral level.  Conus medullaris: Extends to the L1 level and appears normal.  Paraspinal and other soft tissues: Unremarkable  Disc levels:  L1-2:  Unremarkable.  L2-3: No impingement. For lateral right lateral spurring and extraforaminal disc protrusion observed, this does not appear to significantly impinge on the right L2 nerve.  L3-4: No impingement. Mild disc bulge. Should left inferior foraminal disc protrusion.  L4-5: No impingement. Bilateral facet arthropathy and disc bulge with left lateral extraforaminal annular tear.  L5-S1: No impingement. Central disc protrusion. Disc bulge. Facet arthropathy bilaterally.  IMPRESSION: 1. Lumbar spondylosis and degenerative disc disease at multiple levels, but without observed overt impingement. 2. Degenerative facet edema at the L4-5 and L5-S1 levels bilaterally. 3. Type 1 degenerative endplate findings at L5-S1 with a Schmorl's node along the inferior endplate of L5.   Electronically Signed   By: Gaylyn Rong M.D.   On: 06/10/2016 14:06        PMFS History: Patient Active Problem List   Diagnosis Date Noted  . Other intervertebral disc degeneration, lumbar region 07/26/2016   No past medical history on file.  No family history on file.  No past surgical history on file. Social History   Occupational History  . Not on file.   Social History Main Topics   . Smoking status: Former Games developer  . Smokeless tobacco: Never Used  . Alcohol use Not on file  . Drug use: Unknown  . Sexual activity: Not on file

## 2016-07-26 DIAGNOSIS — M5136 Other intervertebral disc degeneration, lumbar region: Secondary | ICD-10-CM | POA: Insufficient documentation

## 2016-08-11 ENCOUNTER — Encounter (INDEPENDENT_AMBULATORY_CARE_PROVIDER_SITE_OTHER): Payer: Self-pay | Admitting: Physical Medicine and Rehabilitation

## 2016-08-11 ENCOUNTER — Ambulatory Visit (INDEPENDENT_AMBULATORY_CARE_PROVIDER_SITE_OTHER): Payer: BLUE CROSS/BLUE SHIELD | Admitting: Physical Medicine and Rehabilitation

## 2016-08-11 ENCOUNTER — Encounter (INDEPENDENT_AMBULATORY_CARE_PROVIDER_SITE_OTHER): Payer: BLUE CROSS/BLUE SHIELD | Admitting: Physical Medicine and Rehabilitation

## 2016-08-11 ENCOUNTER — Ambulatory Visit (INDEPENDENT_AMBULATORY_CARE_PROVIDER_SITE_OTHER): Payer: BLUE CROSS/BLUE SHIELD

## 2016-08-11 VITALS — BP 135/90 | HR 78 | Temp 98.3°F

## 2016-08-11 DIAGNOSIS — M5416 Radiculopathy, lumbar region: Secondary | ICD-10-CM

## 2016-08-11 DIAGNOSIS — M47816 Spondylosis without myelopathy or radiculopathy, lumbar region: Secondary | ICD-10-CM

## 2016-08-11 MED ORDER — METHYLPREDNISOLONE ACETATE 80 MG/ML IJ SUSP
80.0000 mg | Freq: Once | INTRAMUSCULAR | Status: AC
  Administered 2016-08-11: 80 mg

## 2016-08-11 MED ORDER — LIDOCAINE HCL (PF) 1 % IJ SOLN
0.3300 mL | Freq: Once | INTRAMUSCULAR | Status: AC
  Administered 2016-08-11: 0.3 mL

## 2016-08-11 NOTE — Patient Instructions (Signed)

## 2016-08-11 NOTE — Progress Notes (Signed)
Kayla Esparza - 56 y.o. female MRN [id number]  Date of birth: 12-03-1960  Office Visit Note: Visit Date: 08/11/2016 PCP: Ardyth Gal, MD Referred by: Ardyth Gal, MD  Subjective: Chief Complaint  Patient presents with  . Lower Back - Pain   HPI: Kayla Esparza is a 56 year old female with chronic worsening severe lower back pain equal on both sides. Radiates down both legs equally to the hamstring area. No numbness or tingling. Standing for long periods makes pain worse. Also increased pain when trying to sleep. She is followed by Dr. Ophelia Charter who obtained an MRI showing mostly degenerative facet joint edema and arthritis at L4-5 L5-S1.     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 ROS Otherwise per HPI.  Assessment & Plan: Visit Diagnoses:  1. Lumbar radiculopathy   2. Spondylosis without myelopathy or radiculopathy, lumbar region     Plan: Findings:  Bilateral L5-S1 facet joint blocks.    Meds & Orders:  Meds ordered this encounter  Medications  . lidocaine (PF) (XYLOCAINE) 1 % injection 0.3 mL  . methylPREDNISolone acetate (DEPO-MEDROL) injection 80 mg    Orders Placed This Encounter  Procedures  . Facet Injection  . XR C-ARM NO REPORT    Follow-up: Return for Dr. Ophelia Charter.   Procedures: No procedures performed  Lumbar Facet Joint Intra-Articular Injection(s) with Fluoroscopic Guidance  Patient: Kayla Esparza      Date of Birth: [birth year] MRN: [id number] PCP: Ardyth Gal, MD      Visit Date: 08/11/2016   Universal Protocol:    Date/Time: 04/05/186:31 AM  Consent Given By: the patient  Position: PRONE   Additional Comments: Vital signs were monitored before and after the procedure. Patient was prepped and draped in the usual sterile fashion. The correct patient, procedure, and site was verified.   Injection Procedure Details:  Procedure Site One Meds Administered:  Meds ordered this encounter  Medications  . lidocaine (PF) (XYLOCAINE) 1 % injection  0.3 mL  . methylPREDNISolone acetate (DEPO-MEDROL) injection 80 mg     Laterality: Bilateral  Location/Site:  L5-S1  Needle size: 22 guage  Needle type: Spinal  Needle Placement: Articular  Findings:  -Contrast Used: 1 mL iohexol 180 mg iodine/mL   -Comments: Excellent flow of contrast producing a partial arthrogram.  Procedure Details: The fluoroscope beam is vertically oriented in AP, and the inferior recess is visualized beneath the lower pole of the inferior apophyseal process, which represents the target point for needle insertion. When direct visualization is difficult the target point is located at the medial projection of the vertebral pedicle. The region overlying each aforementioned target is locally anesthetized with a 1 to 2 ml. volume of 1% Lidocaine without Epinephrine.   The spinal needle was inserted into each of the above mentioned facet joints using biplanar fluoroscopic guidance. A 0.25 to 0.5 ml. volume of Isovue-250 was injected and a partial facet joint arthrogram was obtained. A single spot film was obtained of the resulting arthrogram.    One to 1.25 ml of the steroid/anesthetic solution was then injected into each of the facet joints noted above.   Additional Comments:  The patient tolerated the procedure well Dressing: Band-Aid    Post-procedure details: Patient was observed during the procedure. Post-procedure instructions were reviewed.  Patient left the clinic in stable condition.     Clinical History: Lumbar spine MRI dated 06/10/2016 IMPRESSION: 1. Lumbar spondylosis and degenerative disc disease at multiple levels, but without observed overt impingement. 2. Degenerative facet edema  at the L4-5 and L5-S1 levels bilaterally. 3. Type 1 degenerative endplate findings at L5-S1 with a Schmorl's node along the inferior endplate of L5.  She reports that she has quit smoking. She has never used smokeless tobacco. No results for input(s): HGBA1C,  LABURIC in the last 8760 hours.  Objective:  VS:  HT:    WT:   BMI:     BP:135/90  HR:78bpm  TEMP:98.3 F (36.8 C)(Oral)  RESP:98 % Physical Exam  Musculoskeletal:  Patient stands with a forward flexed spine. She has pain with extension rotation concordant pain with that. She has good distal strength.    Ortho Exam Imaging: Xr C-arm No Report  Result Date: 08/11/2016 Please see Notes or Procedures tab for imaging impression.   Past Medical/Family/Surgical/Social History: Medications & Allergies reviewed per EMR Patient Active Problem List   Diagnosis Date Noted  . Other intervertebral disc degeneration, lumbar region 07/26/2016   History reviewed. No pertinent past medical history. History reviewed. No pertinent family history. History reviewed. No pertinent surgical history. Social History   Occupational History  . Not on file.   Social History Main Topics  . Smoking status: Former Games developer  . Smokeless tobacco: Never Used  . Alcohol use Not on file  . Drug use: Unknown  . Sexual activity: Not on file

## 2016-08-12 NOTE — Procedures (Signed)
Lumbar Facet Joint Intra-Articular Injection(s) with Fluoroscopic Guidance  Patient: Kayla Esparza      Date of Birth: 07/27/1960 MRN: [id number] PCP: Ardyth Gal, MD      Visit Date: 08/11/2016   Universal Protocol:    Date/Time: 04/05/186:31 AM  Consent Given By: the patient  Position: PRONE   Additional Comments: Vital signs were monitored before and after the procedure. Patient was prepped and draped in the usual sterile fashion. The correct patient, procedure, and site was verified.   Injection Procedure Details:  Procedure Site One Meds Administered:  Meds ordered this encounter  Medications  . lidocaine (PF) (XYLOCAINE) 1 % injection 0.3 mL  . methylPREDNISolone acetate (DEPO-MEDROL) injection 80 mg     Laterality: Bilateral  Location/Site:  L5-S1  Needle size: 22 guage  Needle type: Spinal  Needle Placement: Articular  Findings:  -Contrast Used: 1 mL iohexol 180 mg iodine/mL   -Comments: Excellent flow of contrast producing a partial arthrogram.  Procedure Details: The fluoroscope beam is vertically oriented in AP, and the inferior recess is visualized beneath the lower pole of the inferior apophyseal process, which represents the target point for needle insertion. When direct visualization is difficult the target point is located at the medial projection of the vertebral pedicle. The region overlying each aforementioned target is locally anesthetized with a 1 to 2 ml. volume of 1% Lidocaine without Epinephrine.   The spinal needle was inserted into each of the above mentioned facet joints using biplanar fluoroscopic guidance. A 0.25 to 0.5 ml. volume of Isovue-250 was injected and a partial facet joint arthrogram was obtained. A single spot film was obtained of the resulting arthrogram.    One to 1.25 ml of the steroid/anesthetic solution was then injected into each of the facet joints noted above.   Additional Comments:  The patient tolerated the procedure  well Dressing: Band-Aid    Post-procedure details: Patient was observed during the procedure. Post-procedure instructions were reviewed.  Patient left the clinic in stable condition.

## 2016-08-24 ENCOUNTER — Ambulatory Visit (INDEPENDENT_AMBULATORY_CARE_PROVIDER_SITE_OTHER): Payer: BLUE CROSS/BLUE SHIELD | Admitting: Orthopaedic Surgery

## 2018-01-21 DIAGNOSIS — G4700 Insomnia, unspecified: Secondary | ICD-10-CM | POA: Insufficient documentation

## 2018-01-21 DIAGNOSIS — R4589 Other symptoms and signs involving emotional state: Secondary | ICD-10-CM | POA: Insufficient documentation

## 2018-01-27 IMAGING — MR MR LUMBAR SPINE W/O CM
4 of 5 series · 18 of 48 positions shown · non-contrast
Comparison: 04/22/2016

CLINICAL DATA: Low back pain.  Bilateral hip and leg pain.

EXAM:
MRI LUMBAR SPINE WITHOUT CONTRAST
TECHNIQUE: Multiplanar, multisequence MR imaging of the lumbar spine was
performed. No intravenous contrast was administered.

[Series 6: T2 · sagittal · 4.0mm · 0.73mm/px · 6 of 15 slices shown (1 of 2)]
[im 1/15]
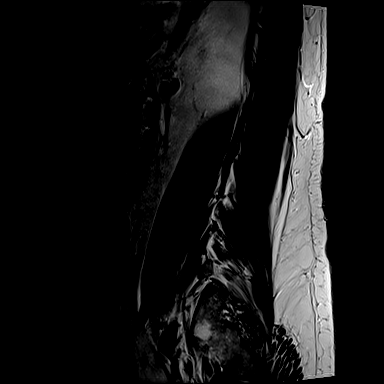
[im 3/15]
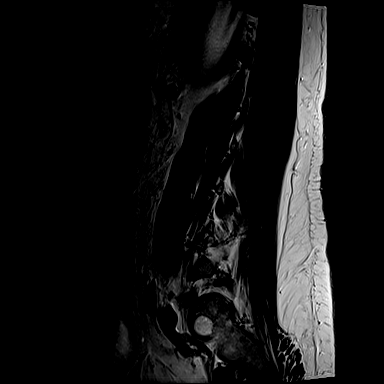
[im 6/15]
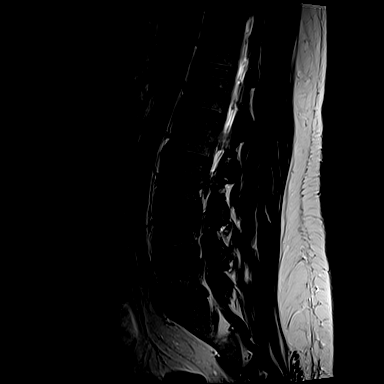
[im 9/15]
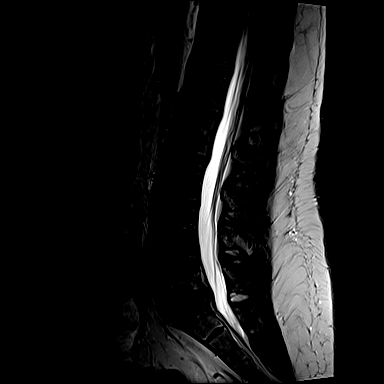
[im 12/15]
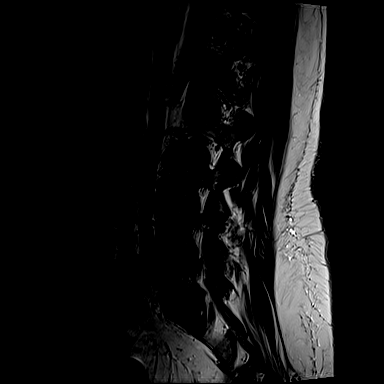
[im 15/15]
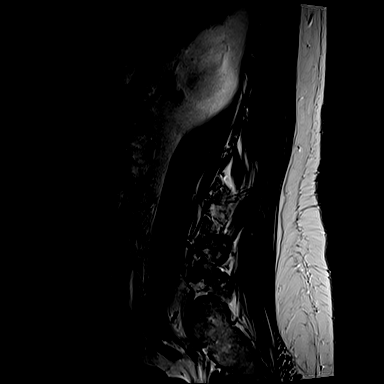

[Series 7: T1 · sagittal · 4.0mm · 0.73mm/px · 3 of 15 slices shown (1 of 2)]
[im 3/15]
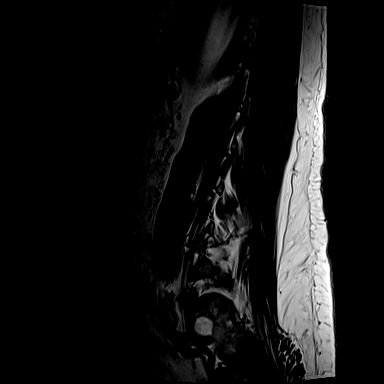
[im 8/15]
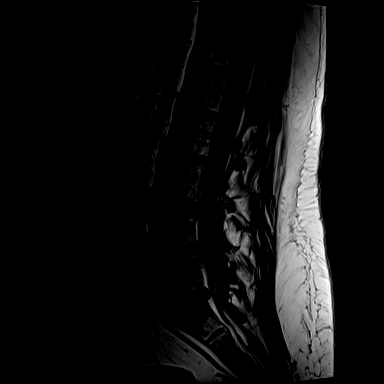
[im 12/15]
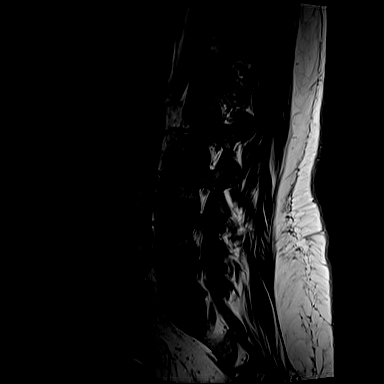

[Series 11: T1 · axial · 4.0mm · 0.28mm/px · z∈[-43,+88]mm · 3 of 32 slices shown (2 of 2)]
[im 5/32]
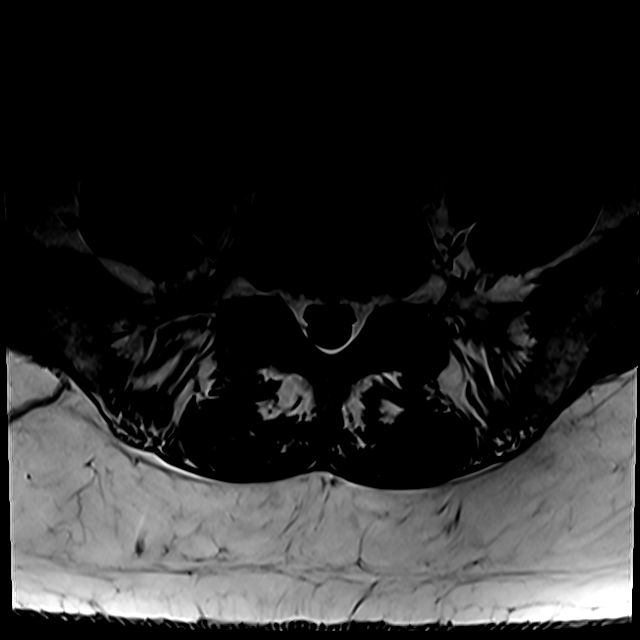
[im 17/32]
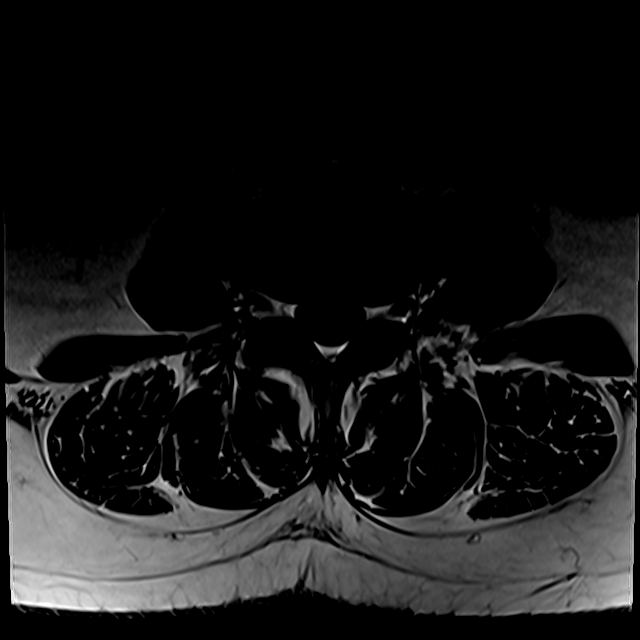
[im 27/32]
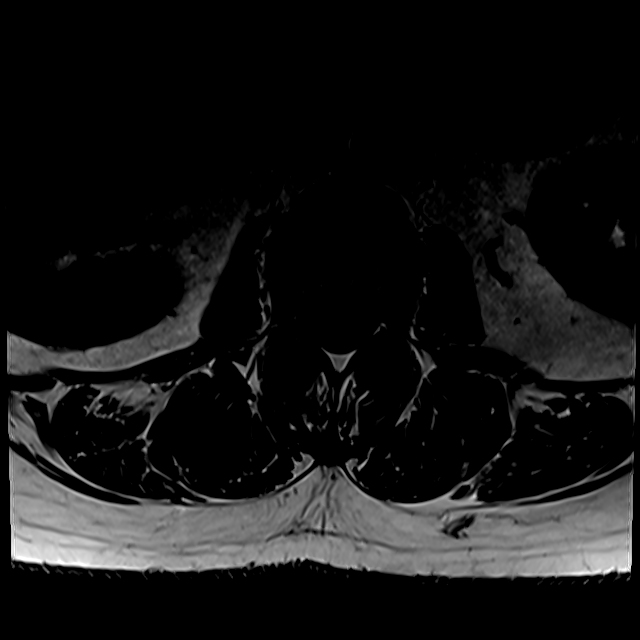

[Series 14: T2 · axial · 4.0mm · 0.28mm/px · z∈[-62,+88]mm · 6 of 32 slices shown (2 of 2)]
[im 1/32]
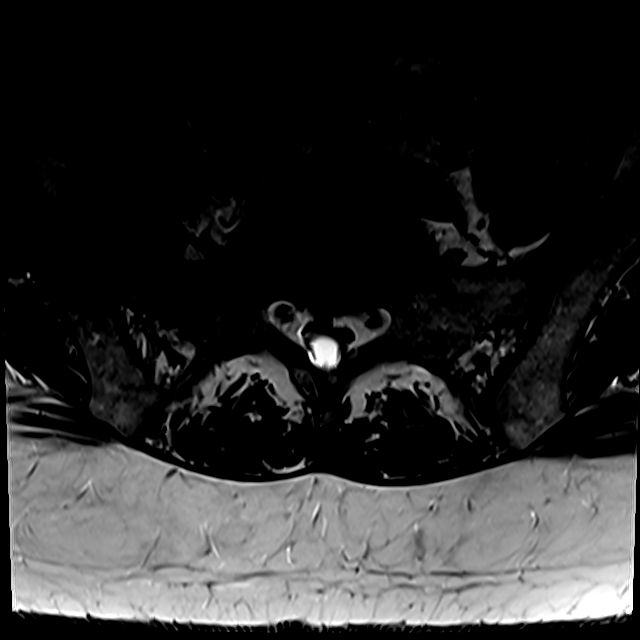
[im 5/32]
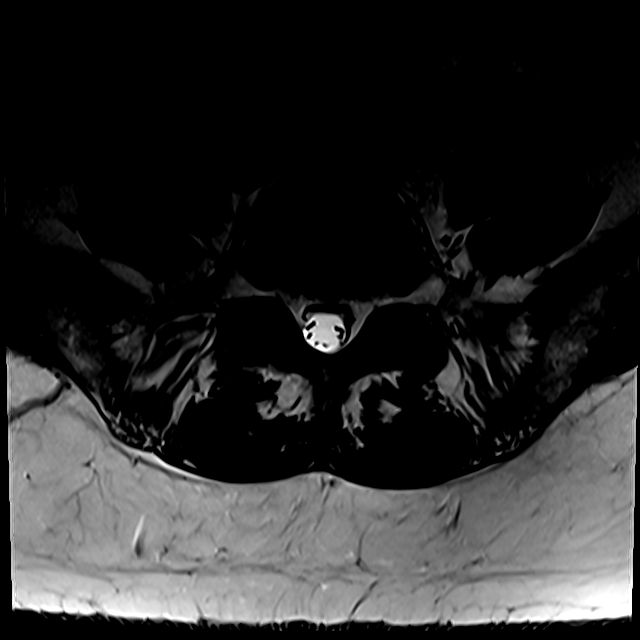
[im 10/32]
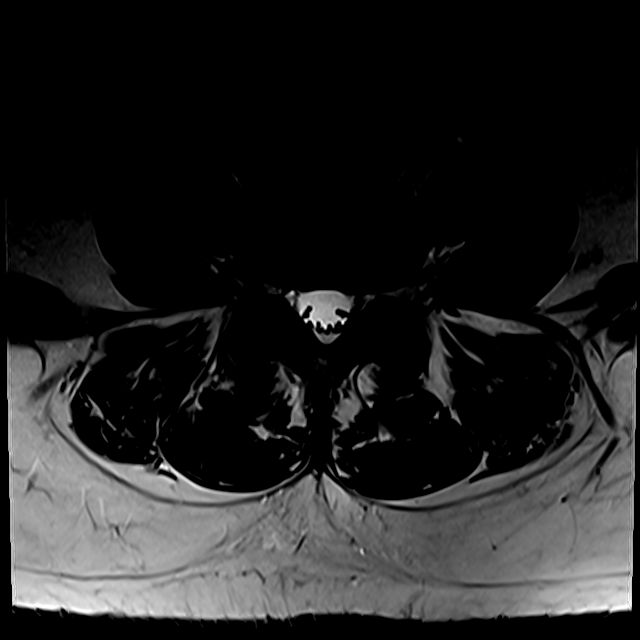
[im 15/32]
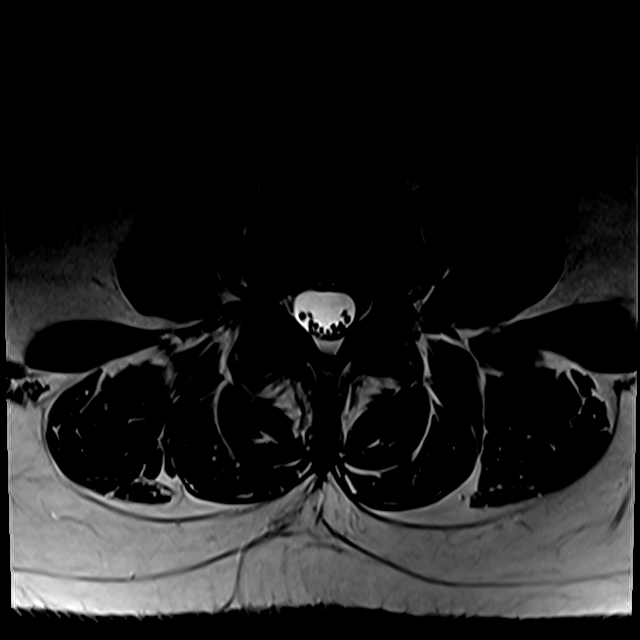
[im 17/32]
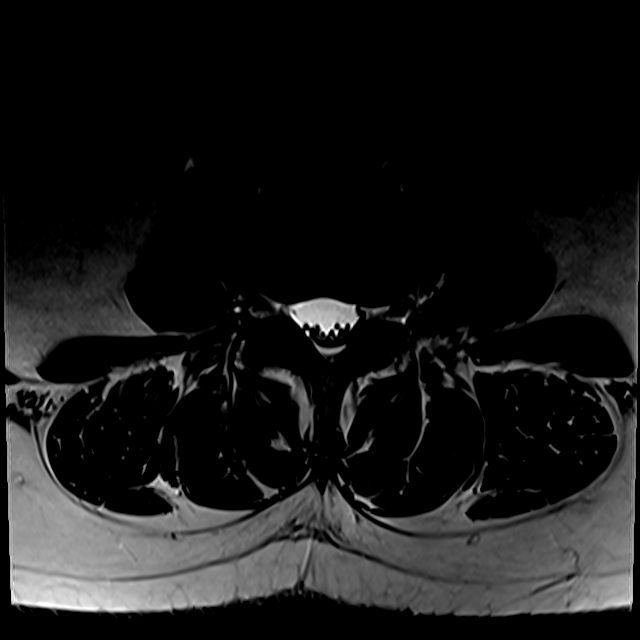
[im 27/32]
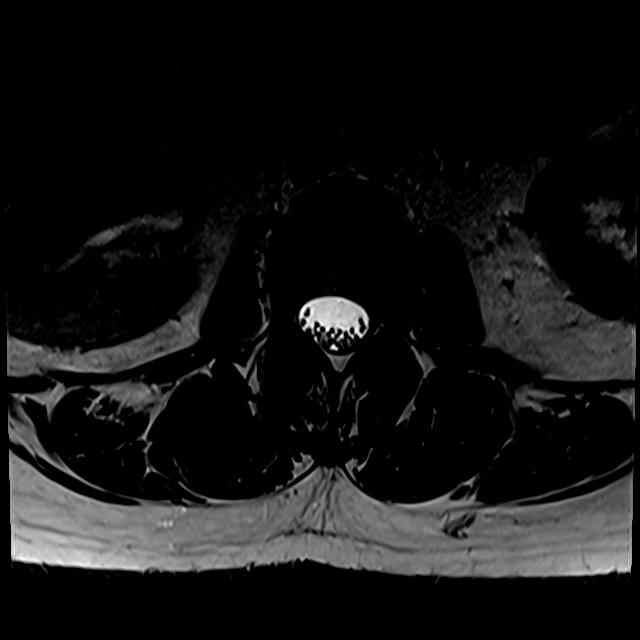

[18 of 48 positions shown; findings below may reference images not displayed]

FINDINGS: Segmentation: The lowest lumbar type non-rib-bearing vertebra is
labeled as L5.

Alignment:  No vertebral subluxation is observed.

Vertebrae: Type 1 degenerative endplate findings at L5-S1 with a
broad Schmorl's node along the inferior endplate of L5.

Mild degenerative facet edema bilaterally at L4-5 and L5-S1.
Hemangioma eccentric to the left in the S1 vertebral level.

Conus medullaris: Extends to the L1 level and appears normal.

Paraspinal and other soft tissues: Unremarkable

Disc levels:

L1-2:  Unremarkable.

L2-3: No impingement. For lateral right lateral spurring and
extraforaminal disc protrusion observed, this does not appear to
significantly impinge on the right L2 nerve.

L3-4: No impingement. Mild disc bulge. Should left inferior
foraminal disc protrusion.

L4-5: No impingement. Bilateral facet arthropathy and disc bulge
with left lateral extraforaminal annular tear.

L5-S1: No impingement. Central disc protrusion. Disc bulge. Facet
arthropathy bilaterally.
IMPRESSION: 1. Lumbar spondylosis and degenerative disc disease at multiple
levels, but without observed overt impingement.
2. Degenerative facet edema at the L4-5 and L5-S1 levels
bilaterally.
3. Type 1 degenerative endplate findings at L5-S1 with a Schmorl's
node along the inferior endplate of L5.

## 2018-02-08 ENCOUNTER — Ambulatory Visit: Payer: BLUE CROSS/BLUE SHIELD | Admitting: Psychiatry

## 2018-02-08 ENCOUNTER — Encounter: Payer: Self-pay | Admitting: Psychiatry

## 2018-02-08 VITALS — BP 121/76 | HR 66 | Ht 66.0 in

## 2018-02-08 DIAGNOSIS — F422 Mixed obsessional thoughts and acts: Secondary | ICD-10-CM | POA: Diagnosis not present

## 2018-02-08 DIAGNOSIS — F3181 Bipolar II disorder: Secondary | ICD-10-CM

## 2018-02-08 DIAGNOSIS — F5105 Insomnia due to other mental disorder: Secondary | ICD-10-CM

## 2018-02-08 DIAGNOSIS — F99 Mental disorder, not otherwise specified: Secondary | ICD-10-CM | POA: Diagnosis not present

## 2018-02-08 MED ORDER — LAMOTRIGINE 200 MG PO TABS: 200.0000 mg | ORAL_TABLET | Freq: Two times a day (BID) | ORAL | 1 refills | Status: DC

## 2018-02-08 MED ORDER — DULOXETINE HCL 60 MG PO CPEP: 60.0000 mg | ORAL_CAPSULE | Freq: Every day | ORAL | 1 refills | Status: DC

## 2018-02-08 MED ORDER — ALPRAZOLAM 1 MG PO TABS: ORAL_TABLET | ORAL | 3 refills | Status: DC

## 2018-02-08 MED ORDER — CLONAZEPAM 1 MG PO TABS: 1.0000 mg | ORAL_TABLET | Freq: Every day | ORAL | 3 refills | Status: DC

## 2018-02-08 NOTE — Progress Notes (Signed)
Kayla Esparza [id number] December 13, 1960 57 y.o.  Assessment: Plan:    Pt reports that she would like to continue current medications since she attributes occasional sadness and anxiety to current psychosocial stressors and reports that overall these s/s are better controlled compared.  Mixed obsessional thoughts and acts - Plan: ALPRAZolam (XANAX) 1 MG tablet, clonazePAM (KLONOPIN) 1 MG tablet  Bipolar II disorder (HCC) - Plan: lamoTRIgine (LAMICTAL) 200 MG tablet, DULoxetine (CYMBALTA) 60 MG capsule  Insomnia due to other mental disorder - Plan: clonazePAM (KLONOPIN) 1 MG tablet  Please see After Visit Summary for patient specific instructions.  Future Appointments  Date Time Provider Department Center  05/11/2018  1:00 PM Corie Chiquito, PMHNP [facility] None    No orders of the defined types were placed in this encounter.      Subjective:   Patient ID:  Kayla Esparza is a 57 y.o. (DOB 09-11-1960) female.  Chief Complaint:  Chief Complaint  Patient presents with  . Anxiety  . Depression  . Insomnia    Anxiety  Symptoms include confusion, insomnia and nervous/anxious behavior. Patient reports no suicidal ideas.    Depression         Associated symptoms include insomnia.  Associated symptoms include no suicidal ideas.  Past medical history includes anxiety.   Insomnia  Primary symptoms: sleep disturbance.  PMH includes: depression.   Kayla Esparza presents to the office today for f/u of mood and anxiety s/s. Reports that she continues to have stress related to family member reportedly taking money out of her mother's estate. She reports frequent worry and rumination about this situation. "Other than that I am ok." Reports that she has not had any recent panic attacks. Reports that her mood is "ok, I cry a lot." reports crying when thinking about different things- "just have a heavy heart." She reports some sadness in response to family conflict, loss of her father, and mother moving out of  town. She denies irritability. Describes her sleep as "good and bad...some nights are better than others." Reports sleep is disrupted when she has more anxious thoughts. Reports typically having a difficult time falling asleep. Reports stable appetite. Reports intentional 25 lb weight loss. "My energy has been pretty good." Energy and motivation are better when she is working and has something to focus on. Reports feeling more tired when they are home between periods of increased work. Reports motivation is low for household chores. Reports periods of forgetfulness and word finding difficulties. Denies SI.   Reports that she stopped Trileptal due to insomnia and HA.   Medications: I have reviewed the patient's current medications.  Allergies:  Allergies  Allergen Reactions  . Abilify [Aripiprazole]   . Ambien [Zolpidem]   . Darvon [Propoxyphene]     Darvocet  . Percocet [Oxycodone-Acetaminophen]     Past Medical History:  Diagnosis Date  . Thyroid disease     History reviewed. No pertinent surgical history.  Family History  Problem Relation Age of Onset  . Depression Father   . Depression Sister   . Depression Brother   . Depression Son     Social History   Socioeconomic History  . Marital status: Married    Spouse name: Not on file  . Number of children: Not on file  . Years of education: Not on file  . Highest education level: Not on file  Occupational History  . Not on file  Social Needs  . Financial resource strain: Not on file  . Food  insecurity:    Worry: Not on file    Inability: Not on file  . Transportation needs:    Medical: Not on file    Non-medical: Not on file  Tobacco Use  . Smoking status: Former Games developer  . Smokeless tobacco: Never Used  Substance and Sexual Activity  . Alcohol use: Not on file  . Drug use: Not on file  . Sexual activity: Not on file  Lifestyle  . Physical activity:    Days per week: Not on file    Minutes per session: Not on  file  . Stress: Not on file  Relationships  . Social connections:    Talks on phone: Not on file    Gets together: Not on file    Attends religious service: Not on file    Active member of club or organization: Not on file    Attends meetings of clubs or organizations: Not on file    Relationship status: Not on file  . Intimate partner violence:    Fear of current or ex partner: Not on file    Emotionally abused: Not on file    Physically abused: Not on file    Forced sexual activity: Not on file  Other Topics Concern  . Not on file  Social History Narrative  . Not on file    Past Medical History, Surgical history, Social history, and Family history were reviewed and updated as appropriate.   Please see review of systems for further details on the patient's review from today.   Review of Systems:  Review of Systems  HENT: Positive for rhinorrhea.   Gastrointestinal: Negative.   Psychiatric/Behavioral: Positive for confusion, depression and sleep disturbance. Negative for suicidal ideas. The patient is nervous/anxious and has insomnia.     Objective:   Physical Exam:  BP 121/76   Pulse 66   Ht 5\' 6"  (1.676 m)   BMI 34.70 kg/m   Physical Exam  Constitutional: She is oriented to person, place, and time. She appears well-developed. No distress.  Musculoskeletal: Normal range of motion.  Neurological: She is alert and oriented to person, place, and time. Coordination normal.  Psychiatric: Her speech is normal and behavior is normal. Judgment and thought content normal. Her mood appears anxious. Her affect is not angry, not blunt, not labile and not inappropriate. Cognition and memory are normal. She exhibits a depressed mood. She expresses no suicidal ideation.  Tearful at times    Lab Review:  No results found for: NA, K, CL, CO2, GLUCOSE, BUN, CREATININE, CALCIUM, PROT, ALBUMIN, AST, ALT, ALKPHOS, BILITOT, GFRNONAA, GFRAA  No results found for: WBC, RBC, HGB, HCT,  PLT, MCV, MCH, MCHC, RDW, LYMPHSABS, MONOABS, EOSABS, BASOSABS -------------------------------

## 2018-04-11 ENCOUNTER — Ambulatory Visit: Payer: BLUE CROSS/BLUE SHIELD | Admitting: Psychiatry

## 2018-05-11 ENCOUNTER — Ambulatory Visit: Payer: BLUE CROSS/BLUE SHIELD | Admitting: Psychiatry

## 2018-05-15 ENCOUNTER — Telehealth: Payer: Self-pay | Admitting: Psychiatry

## 2018-05-15 NOTE — Telephone Encounter (Signed)
Last fill I can see in [place] is 03/13/2018 #30. Office visit 05/24/2018 Can you escribe if appropriate?  Thank you

## 2018-05-15 NOTE — Telephone Encounter (Signed)
PATIENT STATED SHE HAD SCRIPT FOR COLOZAPAM TRANSFERRED FROM THE [facility] IN [place] TO A PHARMACY IN [place], PATIENT IS NO LONGER IN [place], SHE HAS 2 REFILLS LEFT WOULD LIKE SCRIPT TRANSFERRED BACK TO [facility] IN [place].  SHE CAN BE REACHED AT [phone]

## 2018-05-16 ENCOUNTER — Telehealth: Payer: Self-pay | Admitting: Psychiatry

## 2018-05-16 DIAGNOSIS — F99 Mental disorder, not otherwise specified: Secondary | ICD-10-CM

## 2018-05-16 DIAGNOSIS — F422 Mixed obsessional thoughts and acts: Secondary | ICD-10-CM

## 2018-05-16 DIAGNOSIS — F5105 Insomnia due to other mental disorder: Secondary | ICD-10-CM

## 2018-05-16 MED ORDER — CLONAZEPAM 1 MG PO TABS: 1.0000 mg | ORAL_TABLET | Freq: Every day | ORAL | 3 refills | Status: DC

## 2018-05-16 MED ORDER — ALPRAZOLAM 1 MG PO TABS: ORAL_TABLET | ORAL | 3 refills | Status: DC

## 2018-05-16 NOTE — Telephone Encounter (Signed)
Klonopin rx sent.

## 2018-05-16 NOTE — Telephone Encounter (Signed)
She was requesting her Clonazepam 1mg  tablets

## 2018-05-24 ENCOUNTER — Ambulatory Visit: Payer: BLUE CROSS/BLUE SHIELD | Admitting: Psychiatry

## 2018-06-05 ENCOUNTER — Encounter: Payer: Self-pay | Admitting: Psychiatry

## 2018-06-05 ENCOUNTER — Ambulatory Visit: Payer: BLUE CROSS/BLUE SHIELD | Admitting: Psychiatry

## 2018-06-05 DIAGNOSIS — F3181 Bipolar II disorder: Secondary | ICD-10-CM

## 2018-06-05 DIAGNOSIS — F422 Mixed obsessional thoughts and acts: Secondary | ICD-10-CM | POA: Diagnosis not present

## 2018-06-05 MED ORDER — RISPERIDONE 0.5 MG PO TABS: ORAL_TABLET | ORAL | 1 refills | Status: DC

## 2018-06-05 MED ORDER — ALPRAZOLAM 1 MG PO TABS: ORAL_TABLET | ORAL | 1 refills | Status: DC

## 2018-06-05 MED ORDER — LAMOTRIGINE 200 MG PO TABS: 200.0000 mg | ORAL_TABLET | Freq: Two times a day (BID) | ORAL | 1 refills | Status: DC

## 2018-06-05 NOTE — Progress Notes (Signed)
Kayla Esparza [id number] June 25, 1960 58 y.o.  Subjective:   Patient ID:  Kayla Esparza is a 58 y.o. (DOB June 25, 1960) female.  Chief Complaint:  Chief Complaint  Patient presents with  . Other    Irritatibility, racing thoughts  . Insomnia  . Anxiety    HPI Kayla Esparza presents to the office today for follow-up of mood and anxiety. She reports "I have been pretty moody and argumentative." She reports that she has been "working on a lot of different things at the same time." Reports that she is trying to help multiple people with different issues, even withut them asking. Reports that she has had racing thoughts and feels compelled to research how to solve other people's problems "that have nothing to do with me." She reports that she has had significant difficulty with sleep. Reports that she was awake until 5:30 am recently. Describes increased goal-directed activity despite this being a time where she and her husband rest and recuperate. "I've had a lot of energy or I am completely exhausted." Reports that she has been spending "too much money." Denies risky behavior. Reports that she has "gotten addicted to this app" where she buys things for different people. Denies excessive talkativeness other than repeatedly communicating to son who complains that she send him multiple texts in a row. Denies sad mood. Describes anxiety as "the same as normal." She reports that she has intentionally lost 25 lbs and reports that her husband says she does not eat enough. Reports poor concentration and thoughts are "kind of all over the place." Frequently misplacing objects. Denies SI.   Travels frequently for work with her husband and that they stay at home in January and February.   Leaving tomorrow for a week long trip to [place].   Past Psychiatric Medication Trials: Cymbalta Trileptal- Insomnia, HA's Lamictal Lithium- Took for a long-time and had diarrhea. Mood was probably better.   Xanax Klonopin Lexapro Prozac Paxil Effexor  Wellbutrin Latuda- "felt drunk" Abilify- allergic reaction Risperdal- Took briefly Ambien- allergic reaction   Review of Systems:  Review of Systems  Musculoskeletal: Negative for gait problem.  Neurological: Negative for tremors.  Psychiatric/Behavioral:       Please refer to HPI    Medications: I have reviewed the patient's current medications.  Current Outpatient Medications  Medication Sig Dispense Refill  . acetaminophen (TYLENOL) 325 MG tablet Take 650 mg by mouth every 6 (six) hours as needed.    Marland Kitchen. albuterol (PROVENTIL HFA;VENTOLIN HFA) 108 (90 Base) MCG/ACT inhaler Take 2 puffs every 4 to 6 hour prn wheezing    . ALPRAZolam (XANAX) 1 MG tablet Take 1/2 to 1 po BID prn as needed for anxiety 60 tablet 1  . clonazePAM (KLONOPIN) 1 MG tablet Take 1 tablet (1 mg total) by mouth at bedtime. 30 tablet 3  . levothyroxine (SYNTHROID, LEVOTHROID) 125 MCG tablet Take 125 mcg by mouth.     . cetirizine (ZYRTEC) 10 MG tablet Take 10 mg by mouth daily.    . DULoxetine (CYMBALTA) 60 MG capsule Take 1 capsule (60 mg total) by mouth daily. 90 capsule 1  . lamoTRIgine (LAMICTAL) 200 MG tablet Take 1 tablet (200 mg total) by mouth 2 (two) times daily. 180 tablet 1  . risperiDONE (RISPERDAL) 0.5 MG tablet Take 1/2 tab po QHS x 2-3 nights, then increase to 1 tab po QHS 30 tablet 1   No current facility-administered medications for this visit.     Medication Side Effects: None  Allergies:  Allergies  Allergen Reactions  . Abilify [Aripiprazole]   . Ambien [Zolpidem]   . Darvon [Propoxyphene]     Darvocet  . Percocet [Oxycodone-Acetaminophen]     Past Medical History:  Diagnosis Date  . Thyroid disease     Family History  Problem Relation Age of Onset  . Depression Father   . Depression Sister   . Depression Brother   . Depression Son     Social History   Socioeconomic History  . Marital status: Married    Spouse name:  Not on file  . Number of children: Not on file  . Years of education: Not on file  . Highest education level: Not on file  Occupational History  . Not on file  Social Needs  . Financial resource strain: Not on file  . Food insecurity:    Worry: Not on file    Inability: Not on file  . Transportation needs:    Medical: Not on file    Non-medical: Not on file  Tobacco Use  . Smoking status: Former Games developermoker  . Smokeless tobacco: Never Used  Substance and Sexual Activity  . Alcohol use: Not on file  . Drug use: Not on file  . Sexual activity: Not on file  Lifestyle  . Physical activity:    Days per week: Not on file    Minutes per session: Not on file  . Stress: Not on file  Relationships  . Social connections:    Talks on phone: Not on file    Gets together: Not on file    Attends religious service: Not on file    Active member of club or organization: Not on file    Attends meetings of clubs or organizations: Not on file    Relationship status: Not on file  . Intimate partner violence:    Fear of current or ex partner: Not on file    Emotionally abused: Not on file    Physically abused: Not on file    Forced sexual activity: Not on file  Other Topics Concern  . Not on file  Social History Narrative  . Not on file    Past Medical History, Surgical history, Social history, and Family history were reviewed and updated as appropriate.   Please see review of systems for further details on the patient's review from today.   Objective:   Physical Exam:  BP 131/77   Pulse 71   Physical Exam Constitutional:      General: She is not in acute distress.    Appearance: She is well-developed.  Musculoskeletal:        General: No deformity.  Neurological:     Mental Status: She is alert and oriented to person, place, and time.     Coordination: Coordination normal.  Psychiatric:        Attention and Perception: Perception normal. She is inattentive. She does not perceive  auditory or visual hallucinations.        Mood and Affect: Mood is anxious. Affect is blunt. Affect is not labile, angry or inappropriate.        Behavior: Behavior is hyperactive.        Thought Content: Thought content normal. Thought content does not include homicidal or suicidal ideation. Thought content does not include homicidal or suicidal plan.        Cognition and Memory: Cognition and memory normal.        Judgment: Judgment normal.     Comments: Insight  intact. No delusions.  Dysphoric mood Speech more rapid compared to past exams     Lab Review:  No results found for: NA, K, CL, CO2, GLUCOSE, BUN, CREATININE, CALCIUM, PROT, ALBUMIN, AST, ALT, ALKPHOS, BILITOT, GFRNONAA, GFRAA  No results found for: WBC, RBC, HGB, HCT, PLT, MCV, MCH, MCHC, RDW, LYMPHSABS, MONOABS, EOSABS, BASOSABS  No results found for: POCLITH, LITHIUM   No results found for: PHENYTOIN, PHENOBARB, VALPROATE, CBMZ   .res Assessment: Plan:   Discussed potential benefits, risk, and side effects of risperidone, to include potential risk for movement and metabolic side effects.  Discussed that risperidone would likely help with irritability, mood lability, and insomnia.  Discussed starting with low dose due to her history of adverse reactions to multiple medications and medication sensitivity.  Recommend starting Risperdal 0.5 mg one half tab p.o. nightly for 2-3 nights and then increasing to 1 tab p.o. nightly.  Patient reports that she will likely wait until she returns from trip to [place] to start medication since she has had significant difficulty with medication tolerability in the past.   Recommend continuing lamotrigine 200 mg daily for mood stabilization. Continue Cymbalta 60 mg daily for depression and anxiety.   Recommend continuing Xanax 1 mg 1/2-1 p.o. twice daily as needed anxiety. Recommend continuing Klonopin 1 mg p.o. nightly for insomnia. Patient to follow-up in 4 to 6 weeks or sooner if  clinically indicated. Mixed obsessional thoughts and acts - Plan: ALPRAZolam (XANAX) 1 MG tablet  Bipolar II disorder (HCC) - Plan: risperiDONE (RISPERDAL) 0.5 MG tablet, lamoTRIgine (LAMICTAL) 200 MG tablet  Please see After Visit Summary for patient specific instructions.  Future Appointments  Date Time Provider Department Center  07/03/2018  9:30 AM Corie Chiquito, PMHNP [facility] None    No orders of the defined types were placed in this encounter.     -------------------------------

## 2018-07-03 ENCOUNTER — Ambulatory Visit: Payer: BLUE CROSS/BLUE SHIELD | Admitting: Psychiatry

## 2018-08-04 ENCOUNTER — Other Ambulatory Visit: Payer: Self-pay

## 2018-08-04 DIAGNOSIS — F3181 Bipolar II disorder: Secondary | ICD-10-CM

## 2018-08-04 MED ORDER — DULOXETINE HCL 60 MG PO CPEP: 60.0000 mg | ORAL_CAPSULE | Freq: Every day | ORAL | 1 refills | Status: DC

## 2018-08-14 ENCOUNTER — Other Ambulatory Visit: Payer: Self-pay

## 2018-08-14 ENCOUNTER — Telehealth: Payer: Self-pay | Admitting: Psychiatry

## 2018-08-14 DIAGNOSIS — F422 Mixed obsessional thoughts and acts: Secondary | ICD-10-CM

## 2018-08-14 MED ORDER — ALPRAZOLAM 1 MG PO TABS
ORAL_TABLET | ORAL | 0 refills | Status: DC
Start: 2018-08-14 — End: 2018-10-16

## 2018-08-14 NOTE — Telephone Encounter (Signed)
Will send to provider for approval, last visit was a no show.

## 2018-08-14 NOTE — Telephone Encounter (Signed)
Patient need refill on Klonopin to be sent to pharmacy on file

## 2018-08-14 NOTE — Telephone Encounter (Signed)
Lorazepam last fill was 07/06/2018, will submit for that instead.

## 2018-08-14 NOTE — Telephone Encounter (Signed)
Just picked up her klonopin today, pharmacist thinks she probably is asking for her lorazepam.

## 2018-09-08 ENCOUNTER — Other Ambulatory Visit: Payer: Self-pay | Admitting: Psychiatry

## 2018-09-08 DIAGNOSIS — F99 Mental disorder, not otherwise specified: Secondary | ICD-10-CM

## 2018-09-08 DIAGNOSIS — F5105 Insomnia due to other mental disorder: Secondary | ICD-10-CM

## 2018-09-08 DIAGNOSIS — F422 Mixed obsessional thoughts and acts: Secondary | ICD-10-CM

## 2018-09-10 NOTE — Telephone Encounter (Signed)
Last appt 05/2018, over due for follow up.

## 2018-10-11 ENCOUNTER — Other Ambulatory Visit: Payer: Self-pay | Admitting: Psychiatry

## 2018-10-11 DIAGNOSIS — F5105 Insomnia due to other mental disorder: Secondary | ICD-10-CM

## 2018-10-11 DIAGNOSIS — F422 Mixed obsessional thoughts and acts: Secondary | ICD-10-CM

## 2018-10-11 NOTE — Telephone Encounter (Signed)
Last visit 05/2018 was suppose to follow up in Feb.  No scheduled appts

## 2018-10-12 NOTE — Telephone Encounter (Signed)
Also her mailbox is full, front office unable to reach about an appt

## 2018-10-16 ENCOUNTER — Encounter: Payer: Self-pay | Admitting: Psychiatry

## 2018-10-16 ENCOUNTER — Other Ambulatory Visit: Payer: Self-pay

## 2018-10-16 ENCOUNTER — Ambulatory Visit: Payer: BLUE CROSS/BLUE SHIELD | Admitting: Psychiatry

## 2018-10-16 DIAGNOSIS — F422 Mixed obsessional thoughts and acts: Secondary | ICD-10-CM | POA: Diagnosis not present

## 2018-10-16 DIAGNOSIS — F3181 Bipolar II disorder: Secondary | ICD-10-CM | POA: Diagnosis not present

## 2018-10-16 DIAGNOSIS — F99 Mental disorder, not otherwise specified: Secondary | ICD-10-CM

## 2018-10-16 DIAGNOSIS — F5105 Insomnia due to other mental disorder: Secondary | ICD-10-CM

## 2018-10-16 MED ORDER — LAMOTRIGINE 200 MG PO TABS: 200.0000 mg | ORAL_TABLET | Freq: Two times a day (BID) | ORAL | 1 refills | Status: DC

## 2018-10-16 MED ORDER — CLONAZEPAM 1 MG PO TABS: ORAL_TABLET | ORAL | 3 refills | Status: DC

## 2018-10-16 MED ORDER — DULOXETINE HCL 60 MG PO CPEP: 60.0000 mg | ORAL_CAPSULE | Freq: Every day | ORAL | 1 refills | Status: DC

## 2018-10-16 MED ORDER — ALPRAZOLAM 1 MG PO TABS: ORAL_TABLET | ORAL | 3 refills | Status: DC

## 2018-10-16 NOTE — Progress Notes (Signed)
Kayla Benjaminmy Ganser [id number] [birth year] 58 y.o.  Subjective:   Patient ID:  Kayla Esparza is a 58 y.o. (DOB [birth year]) female.  Chief Complaint:  Chief Complaint  Patient presents with  . Follow-up    h/o mood disturbance, anxiety, and insomnia    HPI Lucilia Alfredo BachCecil presents to the office today for follow-up of depression and anxiety. She reports that she and her husband have not had any income since February. She reports that she had significant depression at the start of the pandemic with increased sleeping and difficulty with concentration. She reports that she then decided to stop watching the news. 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 She reports that she occasionally had altered sleep wake cycle when she first started job and this has improved. Reports sleeping about 7 hours a night. Reports mood has been "the last 2 days I have been kind of blah, but I'm ok. I'm not worried about it." Denies irritability. Denies any significant anxiety. She reports that she has recently read some books for the first time in awhile and reports concentration has improved. Reports adequate energy and motivation have been good. Reports 30 lb intentional weight gain over the past year and has maintained weight loss. Reports appetite has been ok. Denies SI.   Has had some impulsive spending and attributes this to having more time to shop online. Reports that her purchases are typically small. Denies any other manic s/s.   Reports that she decided not to start Risperdal after last visit.  Mother is in an ALF.   Past Psychiatric Medication Trials: Cymbalta Trileptal- Insomnia, HA's Lamictal Lithium- Took for a long-time and had diarrhea. Mood was probably better.  Xanax Klonopin Lexapro Prozac Paxil Effexor  Wellbutrin Latuda- "felt drunk" Abilify- allergic reaction Risperdal- Took briefly Ambien- allergic  reaction   Review of Systems:  Review of Systems  Musculoskeletal: Negative for gait problem.  Allergic/Immunologic: Positive for environmental allergies.  Neurological: Negative for tremors and headaches.  Psychiatric/Behavioral:       Please refer to HPI    Medications: I have reviewed the patient's current medications.  Current Outpatient Medications  Medication Sig Dispense Refill  . acetaminophen (TYLENOL) 325 MG tablet Take 650 mg by mouth every 6 (six) hours as needed.    Marland Kitchen. albuterol (PROVENTIL HFA;VENTOLIN HFA) 108 (90 Base) MCG/ACT inhaler Take 2 puffs every 4 to 6 hour prn wheezing    . ALPRAZolam (XANAX) 1 MG tablet Take 1/2 to 1 po BID prn as needed for anxiety 60 tablet 3  . [START ON 11/09/2018] clonazePAM (KLONOPIN) 1 MG tablet TAKE 1 TABLET(1 MG) BY MOUTH AT BEDTIME 30 tablet 3  . DULoxetine (CYMBALTA) 60 MG capsule Take 1 capsule (60 mg total) by mouth daily. 90 capsule 1  . levothyroxine (SYNTHROID, LEVOTHROID) 125 MCG tablet Take 125 mcg by mouth.     . lamoTRIgine (LAMICTAL) 200 MG tablet Take 1 tablet (200 mg total) by mouth 2 (two) times daily. 180 tablet 1   No current facility-administered medications for this visit.     Medication Side Effects: None  Allergies:  Allergies  Allergen Reactions  . Abilify [Aripiprazole]   . Ambien [Zolpidem]   . Darvon [Propoxyphene]     Darvocet  . Percocet [Oxycodone-Acetaminophen]     Past Medical History:  Diagnosis Date  . Thyroid disease     Family History  Problem Relation Age of Onset  .  Depression Father   . Depression Sister   . Depression Brother   . Depression Son     Social History   Socioeconomic History  . Marital status: Married    Spouse name: Not on file  . Number of children: Not on file  . Years of education: Not on file  . Highest education level: Not on file  Occupational History  . Not on file  Social Needs  . Financial resource strain: Not on file  . Food insecurity:     Worry: Not on file    Inability: Not on file  . Transportation needs:    Medical: Not on file    Non-medical: Not on file  Tobacco Use  . Smoking status: Former Research scientist (life sciences)  . Smokeless tobacco: Never Used  Substance and Sexual Activity  . Alcohol use: Not on file  . Drug use: Not on file  . Sexual activity: Not on file  Lifestyle  . Physical activity:    Days per week: Not on file    Minutes per session: Not on file  . Stress: Not on file  Relationships  . Social connections:    Talks on phone: Not on file    Gets together: Not on file    Attends religious service: Not on file    Active member of club or organization: Not on file    Attends meetings of clubs or organizations: Not on file    Relationship status: Not on file  . Intimate partner violence:    Fear of current or ex partner: Not on file    Emotionally abused: Not on file    Physically abused: Not on file    Forced sexual activity: Not on file  Other Topics Concern  . Not on file  Social History Narrative  . Not on file    Past Medical History, Surgical history, Social history, and Family history were reviewed and updated as appropriate.   Please see review of systems for further details on the patient's review from today.   Objective:   Physical Exam:  There were no vitals taken for this visit.  Physical Exam Constitutional:      General: She is not in acute distress.    Appearance: She is well-developed.  Musculoskeletal:        General: No deformity.  Neurological:     Mental Status: She is alert and oriented to person, place, and time.     Coordination: Coordination normal.  Psychiatric:        Mood and Affect: Mood normal. Mood is not anxious or depressed. Affect is not labile, blunt, angry or inappropriate.        Speech: Speech normal.        Behavior: Behavior normal.        Thought Content: Thought content normal. Thought content does not include homicidal or suicidal ideation. Thought content  does not include homicidal or suicidal plan.        Judgment: Judgment normal.     Comments: Insight intact. No auditory or visual hallucinations. No delusions.      Lab Review:  No results found for: NA, K, CL, CO2, GLUCOSE, BUN, CREATININE, CALCIUM, PROT, ALBUMIN, AST, ALT, ALKPHOS, BILITOT, GFRNONAA, GFRAA  No results found for: WBC, RBC, HGB, HCT, PLT, MCV, MCH, MCHC, RDW, LYMPHSABS, MONOABS, EOSABS, BASOSABS  No results found for: POCLITH, LITHIUM   No results found for: PHENYTOIN, PHENOBARB, VALPROATE, CBMZ   .res Assessment: Plan:   Patient  reports that she would like to continue current medications without any changes since mood and anxiety signs and symptoms are manageable at this time. Continue Cymbalta 60 mg daily for anxiety and depression. Continue Lamictal 200 mg twice daily for mood signs and symptoms. Continue Xanax 1 mg 1/2-1 tab twice daily as needed for anxiety. Continue Klonopin 1 mg p.o. nightly for insomnia. Patient to follow-up in 3 months or sooner if clinically indicated. Patient advised to contact office with any questions, adverse effects, or acute worsening in signs and symptoms.  Bipolar II disorder (HCC) - Plan: lamoTRIgine (LAMICTAL) 200 MG tablet, DULoxetine (CYMBALTA) 60 MG capsule  Mixed obsessional thoughts and acts - Plan: ALPRAZolam (XANAX) 1 MG tablet, clonazePAM (KLONOPIN) 1 MG tablet  Insomnia due to other mental disorder - Plan: clonazePAM (KLONOPIN) 1 MG tablet  Please see After Visit Summary for patient specific instructions.  Future Appointments  Date Time Provider Department Center  01/16/2019  2:00 PM Corie Chiquitoarter, Amillia Biffle, PMHNP CP-CP None    No orders of the defined types were placed in this encounter.     -------------------------------

## 2018-11-23 ENCOUNTER — Other Ambulatory Visit: Payer: Self-pay | Admitting: Psychiatry

## 2018-11-23 DIAGNOSIS — F422 Mixed obsessional thoughts and acts: Secondary | ICD-10-CM

## 2019-01-16 ENCOUNTER — Ambulatory Visit: Payer: BLUE CROSS/BLUE SHIELD | Admitting: Psychiatry

## 2019-01-30 ENCOUNTER — Ambulatory Visit: Payer: BLUE CROSS/BLUE SHIELD | Admitting: Psychiatry

## 2019-02-17 ENCOUNTER — Other Ambulatory Visit: Payer: Self-pay | Admitting: Psychiatry

## 2019-02-17 DIAGNOSIS — F3181 Bipolar II disorder: Secondary | ICD-10-CM

## 2019-03-08 ENCOUNTER — Other Ambulatory Visit: Payer: Self-pay | Admitting: Psychiatry

## 2019-03-08 DIAGNOSIS — F5105 Insomnia due to other mental disorder: Secondary | ICD-10-CM

## 2019-03-08 DIAGNOSIS — F99 Mental disorder, not otherwise specified: Secondary | ICD-10-CM

## 2019-03-08 DIAGNOSIS — F422 Mixed obsessional thoughts and acts: Secondary | ICD-10-CM

## 2019-03-09 NOTE — Telephone Encounter (Signed)
appt 11/13 

## 2019-03-23 ENCOUNTER — Other Ambulatory Visit: Payer: Self-pay

## 2019-03-23 ENCOUNTER — Encounter: Payer: Self-pay | Admitting: Psychiatry

## 2019-03-23 ENCOUNTER — Ambulatory Visit (INDEPENDENT_AMBULATORY_CARE_PROVIDER_SITE_OTHER): Payer: BLUE CROSS/BLUE SHIELD | Admitting: Psychiatry

## 2019-03-23 DIAGNOSIS — F5105 Insomnia due to other mental disorder: Secondary | ICD-10-CM | POA: Diagnosis not present

## 2019-03-23 DIAGNOSIS — F99 Mental disorder, not otherwise specified: Secondary | ICD-10-CM | POA: Diagnosis not present

## 2019-03-23 DIAGNOSIS — F422 Mixed obsessional thoughts and acts: Secondary | ICD-10-CM | POA: Diagnosis not present

## 2019-03-23 DIAGNOSIS — F3181 Bipolar II disorder: Secondary | ICD-10-CM | POA: Diagnosis not present

## 2019-03-23 MED ORDER — DAYVIGO 5 MG PO TABS: 5.0000 mg | ORAL_TABLET | Freq: Every day | ORAL | 0 refills | Status: DC

## 2019-03-23 MED ORDER — DULOXETINE HCL 60 MG PO CPEP: 60.0000 mg | ORAL_CAPSULE | Freq: Every day | ORAL | 0 refills | Status: DC

## 2019-03-23 MED ORDER — ALPRAZOLAM 1 MG PO TABS: ORAL_TABLET | ORAL | 3 refills | Status: DC

## 2019-03-23 MED ORDER — LAMOTRIGINE 200 MG PO TABS: 200.0000 mg | ORAL_TABLET | Freq: Two times a day (BID) | ORAL | 1 refills | Status: DC

## 2019-03-23 NOTE — Progress Notes (Signed)
Kayla Esparza [id number] 07-Apr-1961 58 y.o.  Subjective:   Patient ID:  Kayla Esparza is a 58 y.o. (DOB 07-Apr-1961) female.  Chief Complaint:  Chief Complaint  Patient presents with  . Follow-up    h/o Mood disturbance, Anxiety, and insomnia    HPI Kayla Esparza presents to the office today for follow-up of anxiety and mood. She and her husband are leaving for [place] Wednesday and will be there until 05/03/19 for an art show. She reports that she has some anxiety about this and how it will affect their finances and possible exposure to COVID. She reports not taking Xanax prn qd. In the last week she has taken 1/2-1 BID.   She reports that she has had some depression this week "but other than that I have been fine... not sure if it is the weather" with extensive rain or possibly time change. Energy and motivation have been lower over the last week and has been eating more than usual. Has not been sleeping as well this week and was thinking about things when she lays down. She reports that she typically gets her best sleep early in the morning and generally does not sleep well. Concentration has been ok with occ forgetting what she was about to do. Denies SI.   She reports that energy and motivation had been ok before the last week. Recently washed all of the laundry. Reports that she had been intentionally losing weight. Has had some mood lability with some periods of increased energy and slightly elevated mood. Denies impulsive or risky behavior.   Continues to work part-time.   Past Psychiatric Medication Trials: Cymbalta Trileptal- Insomnia, HA's Lamictal Lithium- Took for a long-time and had diarrhea. Mood was probably better.  Xanax Klonopin Lexapro Prozac Paxil Effexor  Wellbutrin Latuda- "felt drunk" Abilify- allergic reaction Risperdal- Took briefly Ambien- allergic reaction   Review of Systems:  Review of Systems  Musculoskeletal: Negative for gait problem.   Bilateral knee pain.   Neurological: Negative for tremors.  Psychiatric/Behavioral:       Please refer to HPI    Medications: I have reviewed the patient's current medications.  Current Outpatient Medications  Medication Sig Dispense Refill  . acetaminophen (TYLENOL) 325 MG tablet Take 650 mg by mouth every 6 (six) hours as needed.    Marland Kitchen. albuterol (PROVENTIL HFA;VENTOLIN HFA) 108 (90 Base) MCG/ACT inhaler Take 2 puffs every 4 to 6 hour prn wheezing    . ALPRAZolam (XANAX) 1 MG tablet Take 1/2 to 1 po BID prn as needed for anxiety 60 tablet 3  . clonazePAM (KLONOPIN) 1 MG tablet TAKE 1 TABLET(1 MG) BY MOUTH AT BEDTIME 30 tablet 1  . DULoxetine (CYMBALTA) 60 MG capsule Take 1 capsule (60 mg total) by mouth daily. 90 capsule 0  . levothyroxine (SYNTHROID, LEVOTHROID) 125 MCG tablet Take 125 mcg by mouth.     . lamoTRIgine (LAMICTAL) 200 MG tablet Take 1 tablet (200 mg total) by mouth 2 (two) times daily. 180 tablet 1  . Lemborexant (DAYVIGO) 5 MG TABS Take 5 mg by mouth at bedtime for 10 days. 10 tablet 0   No current facility-administered medications for this visit.     Medication Side Effects: None  Allergies:  Allergies  Allergen Reactions  . Abilify [Aripiprazole]   . Ambien [Zolpidem]   . Darvon [Propoxyphene]     Darvocet  . Percocet [Oxycodone-Acetaminophen]     Past Medical History:  Diagnosis Date  . Thyroid disease  Family History  Problem Relation Age of Onset  . Depression Father   . Depression Sister   . Depression Brother   . Depression Son     Social History   Socioeconomic History  . Marital status: Married    Spouse name: Not on file  . Number of children: Not on file  . Years of education: Not on file  . Highest education level: Not on file  Occupational History  . Not on file  Social Needs  . Financial resource strain: Not on file  . Food insecurity    Worry: Not on file    Inability: Not on file  . Transportation needs    Medical: Not  on file    Non-medical: Not on file  Tobacco Use  . Smoking status: Former Research scientist (life sciences)  . Smokeless tobacco: Never Used  Substance and Sexual Activity  . Alcohol use: Not on file  . Drug use: Not on file  . Sexual activity: Not on file  Lifestyle  . Physical activity    Days per week: Not on file    Minutes per session: Not on file  . Stress: Not on file  Relationships  . Social Herbalist on phone: Not on file    Gets together: Not on file    Attends religious service: Not on file    Active member of club or organization: Not on file    Attends meetings of clubs or organizations: Not on file    Relationship status: Not on file  . Intimate partner violence    Fear of current or ex partner: Not on file    Emotionally abused: Not on file    Physically abused: Not on file    Forced sexual activity: Not on file  Other Topics Concern  . Not on file  Social History Narrative  . Not on file    Past Medical History, Surgical history, Social history, and Family history were reviewed and updated as appropriate.   Please see review of systems for further details on the patient's review from today.   Objective:   Physical Exam:  There were no vitals taken for this visit.  Physical Exam Constitutional:      General: She is not in acute distress.    Appearance: She is well-developed.  Musculoskeletal:        General: No deformity.  Neurological:     Mental Status: She is alert and oriented to person, place, and time.     Coordination: Coordination normal.  Psychiatric:        Attention and Perception: Attention and perception normal. She does not perceive auditory or visual hallucinations.        Mood and Affect: Affect is not labile, blunt, angry or inappropriate.        Speech: Speech normal.        Behavior: Behavior normal.        Thought Content: Thought content normal. Thought content is not paranoid or delusional. Thought content does not include homicidal or  suicidal ideation. Thought content does not include homicidal or suicidal plan.        Cognition and Memory: Cognition and memory normal.        Judgment: Judgment normal.     Comments: Insight intact. Mood presents as mildly anxious and depressed.      Lab Review:  No results found for: NA, K, CL, CO2, GLUCOSE, BUN, CREATININE, CALCIUM, PROT, ALBUMIN, AST, ALT, ALKPHOS, BILITOT,  GFRNONAA, GFRAA  No results found for: WBC, RBC, HGB, HCT, PLT, MCV, MCH, MCHC, RDW, LYMPHSABS, MONOABS, EOSABS, BASOSABS  No results found for: POCLITH, LITHIUM   No results found for: PHENYTOIN, PHENOBARB, VALPROATE, CBMZ   .res Assessment: Plan:   Discussed potential benefits, risks, and side effects of Dayvigo for insomnia.  Patient provided with samples to start Dayvigo. Advised pt to start Dayvigo 5 mg po QHS for 3-5 days, then increase to 10 mg po QHS if minimal improvement with 5 mg dose and no tolerability issues. Advised pt to contact office if Kayla Esparza is effective and script would be sent to pharmacy. Pt will be in [place], [place] next week until 05/03/19. She has one refill remaining on Klonopin and plans to have it filled at [facility] in [place], [place]. Discussed that additional refills would be authorized after last refill is used.  Will continue all other medications as prescribed since pt reports that her mood and anxiety have been fairly stable overall and that s/s have been better controlled with current meds and has had less tolerability issues. Pt to f/u in 4 months or sooner if clinically indicated.  Patient advised to contact office with any questions, adverse effects, or acute worsening in signs and symptoms.  Kayla Esparza was seen today for follow-up.  Diagnoses and all orders for this visit:  Bipolar II disorder (HCC) -     DULoxetine (CYMBALTA) 60 MG capsule; Take 1 capsule (60 mg total) by mouth daily. -     lamoTRIgine (LAMICTAL) 200 MG tablet; Take 1 tablet (200 mg total) by mouth 2 (two)  times daily.  Mixed obsessional thoughts and acts -     ALPRAZolam (XANAX) 1 MG tablet; Take 1/2 to 1 po BID prn as needed for anxiety  Other orders -     Lemborexant (DAYVIGO) 5 MG TABS; Take 5 mg by mouth at bedtime for 10 days.     Please see After Visit Summary for patient specific instructions.  Future Appointments  Date Time Provider Department Center  08/23/2019 10:30 AM Corie Chiquito, PMHNP CP-CP None    No orders of the defined types were placed in this encounter.   -------------------------------

## 2019-05-21 ENCOUNTER — Telehealth: Payer: Self-pay | Admitting: Psychiatry

## 2019-05-21 NOTE — Telephone Encounter (Signed)
Patient called and said that she needs a new script sent to the [facility] on [address] in [place] for her klonopin 1 mg

## 2019-05-21 NOTE — Telephone Encounter (Signed)
Patient already had 1 refill on file at her pharmacy called to verify.

## 2019-05-22 ENCOUNTER — Other Ambulatory Visit: Payer: Self-pay | Admitting: Psychiatry

## 2019-05-22 DIAGNOSIS — F422 Mixed obsessional thoughts and acts: Secondary | ICD-10-CM

## 2019-05-22 DIAGNOSIS — F5105 Insomnia due to other mental disorder: Secondary | ICD-10-CM

## 2019-05-22 DIAGNOSIS — F99 Mental disorder, not otherwise specified: Secondary | ICD-10-CM

## 2019-05-22 NOTE — Telephone Encounter (Signed)
Looks like patient should have 1 refill on file from 03/11/2019 Rx, can you check with pharmacy if not we can send a new one.

## 2019-05-23 NOTE — Telephone Encounter (Signed)
Spoke with pharmacy and they do not have one on file. They did get a verbal from you on Monday for #30 with no RF.

## 2019-05-23 NOTE — Telephone Encounter (Signed)
Noted thank you

## 2019-06-14 ENCOUNTER — Other Ambulatory Visit: Payer: Self-pay | Admitting: Psychiatry

## 2019-06-14 DIAGNOSIS — F3181 Bipolar II disorder: Secondary | ICD-10-CM

## 2019-07-03 ENCOUNTER — Other Ambulatory Visit: Payer: Self-pay

## 2019-07-03 DIAGNOSIS — F5105 Insomnia due to other mental disorder: Secondary | ICD-10-CM

## 2019-07-03 DIAGNOSIS — F422 Mixed obsessional thoughts and acts: Secondary | ICD-10-CM

## 2019-07-03 DIAGNOSIS — F99 Mental disorder, not otherwise specified: Secondary | ICD-10-CM

## 2019-07-03 MED ORDER — CLONAZEPAM 1 MG PO TABS: ORAL_TABLET | ORAL | 2 refills | Status: DC

## 2019-08-21 ENCOUNTER — Other Ambulatory Visit: Payer: Self-pay

## 2019-08-21 DIAGNOSIS — F3181 Bipolar II disorder: Secondary | ICD-10-CM

## 2019-08-21 MED ORDER — DULOXETINE HCL 60 MG PO CPEP: 60.0000 mg | ORAL_CAPSULE | Freq: Every day | ORAL | 0 refills | Status: DC

## 2019-08-23 ENCOUNTER — Ambulatory Visit: Payer: BLUE CROSS/BLUE SHIELD | Admitting: Psychiatry

## 2019-08-24 ENCOUNTER — Encounter: Payer: Self-pay | Admitting: Psychiatry

## 2019-08-24 ENCOUNTER — Other Ambulatory Visit: Payer: Self-pay

## 2019-08-24 ENCOUNTER — Ambulatory Visit (INDEPENDENT_AMBULATORY_CARE_PROVIDER_SITE_OTHER): Payer: BLUE CROSS/BLUE SHIELD | Admitting: Psychiatry

## 2019-08-24 DIAGNOSIS — F99 Mental disorder, not otherwise specified: Secondary | ICD-10-CM

## 2019-08-24 DIAGNOSIS — F5105 Insomnia due to other mental disorder: Secondary | ICD-10-CM

## 2019-08-24 DIAGNOSIS — F3181 Bipolar II disorder: Secondary | ICD-10-CM

## 2019-08-24 DIAGNOSIS — F422 Mixed obsessional thoughts and acts: Secondary | ICD-10-CM

## 2019-08-24 MED ORDER — ALPRAZOLAM 1 MG PO TABS: ORAL_TABLET | ORAL | 3 refills | Status: DC

## 2019-08-24 MED ORDER — LAMOTRIGINE 200 MG PO TABS: ORAL_TABLET | ORAL | 1 refills | Status: DC

## 2019-08-24 MED ORDER — DAYVIGO 5 MG PO TABS: 5.0000 mg | ORAL_TABLET | Freq: Every day | ORAL | 0 refills | Status: DC

## 2019-08-24 MED ORDER — DULOXETINE HCL 60 MG PO CPEP: 60.0000 mg | ORAL_CAPSULE | Freq: Every day | ORAL | 0 refills | Status: DC

## 2019-08-24 MED ORDER — CLONAZEPAM 0.5 MG PO TABS: 0.7500 mg | ORAL_TABLET | Freq: Every day | ORAL | 2 refills | Status: DC

## 2019-08-24 NOTE — Progress Notes (Signed)
Kayla Esparza [id number] 1961-03-01 59 y.o.  Subjective:   Patient ID:  Kayla Esparza is a 59 y.o. (DOB 10-01-1960) female.  Chief Complaint:  Chief Complaint  Patient presents with  . Insomnia  . Follow-up    Anxiety, Depression    HPI Kayla Esparza presents to the office today for follow-up of h/o mood, anxiety, and insomnia. She reports that she has been irritable towards her husband. She reports that her thoughts are racing and is thinking about different things. Denies elevated mood. She reports some periods of increased energy and goal-directed activity. Denies impulsivity. She reports that she had stopped spending money on her son and has started doing this again. Denies any recent depression. She reports doing some compulsive counting- steps, how long it takes to pump her gas. 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   She reports ongoing difficulty with sleep. She reports that she will be tired but is unable to fall asleep. Has middle of the night awakenings and cannot return to sleep. Reports that she seems to sleep best in the early mornings. She reports that she is trying to read instead of watching TV when she awakens since this is less stimulating. Averaging about 5-6 hours of sleep. She reports some difficulty with concentration. Has been driving past familiar locations and is using GPS in case she is lost in thought. Reports eating supper only. Describes fluctuations in energy and motivation. Denies SI.  Reports that Klonopin is no longer as effective. Reports that she will have withdrawal s/s if she does not take Klonopin. Reports that she did not start Dayvigo samples.   Husband has not been able to work during [employer]. She has a job at the [employer] 3 days a week, from 9-6 pm.   Past Psychiatric Medication Trials: Cymbalta Trileptal- Insomnia, HA's Lamictal Lithium- Took for a long-time and had diarrhea. Mood was probably better.   Xanax Klonopin Lexapro Prozac Paxil Effexor  Wellbutrin Latuda- "felt drunk" Abilify- allergic reaction Risperdal- Took briefly Ambien- allergic reaction    Review of Systems:  Review of Systems  Musculoskeletal: Positive for arthralgias and myalgias. Negative for gait problem.  Neurological: Negative for tremors.  Psychiatric/Behavioral:       Please refer to HPI    Medications: I have reviewed the patient's current medications.  Current Outpatient Medications  Medication Sig Dispense Refill  . acetaminophen (TYLENOL) 325 MG tablet Take 650 mg by mouth every 6 (six) hours as needed.    Marland Kitchen levothyroxine (SYNTHROID, LEVOTHROID) 125 MCG tablet Take 125 mcg by mouth.     Marland Kitchen albuterol (PROVENTIL HFA;VENTOLIN HFA) 108 (90 Base) MCG/ACT inhaler Take 2 puffs every 4 to 6 hour prn wheezing    . ALPRAZolam (XANAX) 1 MG tablet Take 1/2 to 1 po BID prn as needed for anxiety 60 tablet 3  . clonazePAM (KLONOPIN) 0.5 MG tablet Take 1.5 tablets (0.75 mg total) by mouth at bedtime. 45 tablet 2  . DULoxetine (CYMBALTA) 60 MG capsule Take 1 capsule (60 mg total) by mouth daily. Please put on file 90 capsule 0  . lamoTRIgine (LAMICTAL) 200 MG tablet TAKE 1 TABLET(200 MG) BY MOUTH TWICE DAILY 180 tablet 1  . Lemborexant (DAYVIGO) 5 MG TABS Take 5 mg by mouth at bedtime for 10 days. 10 tablet 0   No current facility-administered medications for this visit.    Medication Side Effects: None  Allergies:  Allergies  Allergen Reactions  . Abilify [Aripiprazole]   .  Ambien [Zolpidem]   . Darvon [Propoxyphene]     Darvocet  . Percocet [Oxycodone-Acetaminophen]     Past Medical History:  Diagnosis Date  . Thyroid disease     Family History  Problem Relation Age of Onset  . Depression Father   . Depression Sister   . Depression Brother   . Depression Son     Social History   Socioeconomic History  . Marital status: Married    Spouse name: Not on file  . Number of children: Not on  file  . Years of education: Not on file  . Highest education level: Not on file  Occupational History  . Not on file  Tobacco Use  . Smoking status: Former Games developer  . Smokeless tobacco: Never Used  Substance and Sexual Activity  . Alcohol use: Not on file  . Drug use: Not on file  . Sexual activity: Not on file  Other Topics Concern  . Not on file  Social History Narrative  . Not on file   Social Determinants of Health   Financial Resource Strain:   . Difficulty of Paying Living Expenses:   Food Insecurity:   . Worried About Programme researcher, broadcasting/film/video in the Last Year:   . Barista in the Last Year:   Transportation Needs:   . Freight forwarder (Medical):   Marland Kitchen Lack of Transportation (Non-Medical):   Physical Activity:   . Days of Exercise per Week:   . Minutes of Exercise per Session:   Stress:   . Feeling of Stress :   Social Connections:   . Frequency of Communication with Friends and Family:   . Frequency of Social Gatherings with Friends and Family:   . Attends Religious Services:   . Active Member of Clubs or Organizations:   . Attends Banker Meetings:   Marland Kitchen Marital Status:   Intimate Partner Violence:   . Fear of Current or Ex-Partner:   . Emotionally Abused:   Marland Kitchen Physically Abused:   . Sexually Abused:     Past Medical History, Surgical history, Social history, and Family history were reviewed and updated as appropriate.   Please see review of systems for further details on the patient's review from today.   Objective:   Physical Exam:  There were no vitals taken for this visit.  Physical Exam Constitutional:      General: She is not in acute distress. Musculoskeletal:        General: No deformity.  Neurological:     Mental Status: She is alert and oriented to person, place, and time.     Coordination: Coordination normal.  Psychiatric:        Attention and Perception: Attention and perception normal. She does not perceive auditory  or visual hallucinations.        Mood and Affect: Mood is anxious. Mood is not depressed. Affect is not labile, blunt, angry or inappropriate.        Speech: Speech normal.        Behavior: Behavior normal.        Thought Content: Thought content normal. Thought content is not paranoid or delusional. Thought content does not include homicidal or suicidal ideation. Thought content does not include homicidal or suicidal plan.        Cognition and Memory: Cognition and memory normal.        Judgment: Judgment normal.     Comments: Insight intact     Lab Review:  No results found for: NA, K, CL, CO2, GLUCOSE, BUN, CREATININE, CALCIUM, PROT, ALBUMIN, AST, ALT, ALKPHOS, BILITOT, GFRNONAA, GFRAA  No results found for: WBC, RBC, HGB, HCT, PLT, MCV, MCH, MCHC, RDW, LYMPHSABS, MONOABS, EOSABS, BASOSABS  No results found for: POCLITH, LITHIUM   No results found for: PHENYTOIN, PHENOBARB, VALPROATE, CBMZ   .res Assessment: Plan:   Discussed pt's concerns about dependence on Klonopin and plan to decrease dose and avoid withdrawal s/s. Recommend decreasing from Klonopin 1 mg one tab po QHS to Klonopin 0.5 mg 1.5 tabs po QHS (total dose of 0.75 mg) until next visit. Discussed long-term plan would be to continue to decrease Klonopin by 0.25 mg over several months.  Pt reports that she would like to start trial of Dayvigo. Discussed potential benefits, risks, and side effects of Dayvigo. Advised to start Dayvigo 5 mg po QHS for 3-5 days, then increase to 10 mg po QHS for insomnia. Continue Cymbalta 60 mg po qd for depression and anxiety.  Continue Lamictal 200 mg po BID for mood stabilization. Continue Xanax prn anxiety. Pt to f/u in 4 months or sooner if clinically indicated.  Patient advised to contact office with any questions, adverse effects, or acute worsening in signs and symptoms.  Kayla Esparza was seen today for insomnia and follow-up.  Diagnoses and all orders for this visit:  Mixed obsessional  thoughts and acts -     clonazePAM (KLONOPIN) 0.5 MG tablet; Take 1.5 tablets (0.75 mg total) by mouth at bedtime. -     ALPRAZolam (XANAX) 1 MG tablet; Take 1/2 to 1 po BID prn as needed for anxiety  Insomnia due to other mental disorder -     clonazePAM (KLONOPIN) 0.5 MG tablet; Take 1.5 tablets (0.75 mg total) by mouth at bedtime. -     Lemborexant (DAYVIGO) 5 MG TABS; Take 5 mg by mouth at bedtime for 10 days.  Bipolar II disorder (HCC) -     lamoTRIgine (LAMICTAL) 200 MG tablet; TAKE 1 TABLET(200 MG) BY MOUTH TWICE DAILY -     DULoxetine (CYMBALTA) 60 MG capsule; Take 1 capsule (60 mg total) by mouth daily. Please put on file     Please see After Visit Summary for patient specific instructions.  Future Appointments  Date Time Provider Department Center  12/24/2019 11:30 AM Corie Chiquito, PMHNP [facility] None    No orders of the defined types were placed in this encounter.   -------------------------------

## 2019-11-28 ENCOUNTER — Other Ambulatory Visit: Payer: Self-pay

## 2019-11-28 DIAGNOSIS — F3181 Bipolar II disorder: Secondary | ICD-10-CM

## 2019-11-28 MED ORDER — DULOXETINE HCL 60 MG PO CPEP: 60.0000 mg | ORAL_CAPSULE | Freq: Every day | ORAL | 0 refills | Status: DC

## 2019-12-05 ENCOUNTER — Telehealth: Payer: Self-pay | Admitting: Psychiatry

## 2019-12-05 DIAGNOSIS — F5105 Insomnia due to other mental disorder: Secondary | ICD-10-CM

## 2019-12-05 DIAGNOSIS — F99 Mental disorder, not otherwise specified: Secondary | ICD-10-CM

## 2019-12-05 NOTE — Telephone Encounter (Signed)
Tried to reach patient to confirm she is taking Dayvigo 10 mg but no answer and mail box full

## 2019-12-05 NOTE — Telephone Encounter (Signed)
Pt called to request Rx for Dayvigo.. Samples worked well. Pharmacy [facility] [place]

## 2019-12-06 MED ORDER — DAYVIGO 5 MG PO TABS: 5.0000 mg | ORAL_TABLET | Freq: Every day | ORAL | 2 refills | Status: DC

## 2019-12-06 NOTE — Telephone Encounter (Signed)
Tried to reach patient again this morning, still no answer and her mailbox is full.

## 2019-12-06 NOTE — Telephone Encounter (Signed)
Noted thanks °

## 2019-12-24 ENCOUNTER — Ambulatory Visit: Payer: Self-pay | Admitting: Psychiatry

## 2020-01-07 ENCOUNTER — Other Ambulatory Visit: Payer: Self-pay

## 2020-01-07 DIAGNOSIS — F422 Mixed obsessional thoughts and acts: Secondary | ICD-10-CM

## 2020-01-07 MED ORDER — ALPRAZOLAM 1 MG PO TABS: ORAL_TABLET | ORAL | 3 refills | Status: DC

## 2020-01-08 ENCOUNTER — Other Ambulatory Visit: Payer: Self-pay

## 2020-01-08 ENCOUNTER — Telehealth: Payer: Self-pay | Admitting: Psychiatry

## 2020-01-08 DIAGNOSIS — F422 Mixed obsessional thoughts and acts: Secondary | ICD-10-CM

## 2020-01-08 DIAGNOSIS — F99 Mental disorder, not otherwise specified: Secondary | ICD-10-CM

## 2020-01-08 DIAGNOSIS — F5105 Insomnia due to other mental disorder: Secondary | ICD-10-CM

## 2020-01-08 MED ORDER — CLONAZEPAM 0.5 MG PO TABS: 0.7500 mg | ORAL_TABLET | Freq: Every day | ORAL | 2 refills | Status: DC

## 2020-01-08 NOTE — Telephone Encounter (Signed)
Pt called for refill on Clonazepam. Call to [facility] on [address] in [place], [place]. Next appt is 9/21

## 2020-01-08 NOTE — Telephone Encounter (Signed)
Last refill 12/05/2019, pended for Shanda Bumps to send

## 2020-01-29 ENCOUNTER — Encounter: Payer: Self-pay | Admitting: Psychiatry

## 2020-01-29 ENCOUNTER — Other Ambulatory Visit: Payer: Self-pay

## 2020-01-29 ENCOUNTER — Ambulatory Visit (INDEPENDENT_AMBULATORY_CARE_PROVIDER_SITE_OTHER): Payer: Self-pay | Admitting: Psychiatry

## 2020-01-29 ENCOUNTER — Encounter (INDEPENDENT_AMBULATORY_CARE_PROVIDER_SITE_OTHER): Payer: Self-pay

## 2020-01-29 DIAGNOSIS — F3181 Bipolar II disorder: Secondary | ICD-10-CM

## 2020-01-29 MED ORDER — DULOXETINE HCL 60 MG PO CPEP: 60.0000 mg | ORAL_CAPSULE | Freq: Every day | ORAL | 1 refills | Status: DC

## 2020-01-29 MED ORDER — LAMOTRIGINE 200 MG PO TABS: ORAL_TABLET | ORAL | 1 refills | Status: DC

## 2020-01-29 NOTE — Progress Notes (Signed)
Houa Nie [id number] 1960-07-01 59 y.o.  Subjective:   Patient ID:  Abrial Arrighi is a 59 y.o. (DOB June 20, 1960) female.  Chief Complaint:  Chief Complaint  Patient presents with  . Follow-up    h/o anxiety, insomnia, and mood disturbance    HPI Sinai Offner presents to the office today for follow-up of anxiety, mood disturbance, and insomnia. She reports that she has been "just ok." She reports that she has had some depression the last few days. She reports occasional depressive episodes for a few days that quickly resolve. Denies any severe depressive episodes. She reports that she has some elevated moods that last a few days. She reports periods of euthymia. Denies any change in irritability. She reports that her anxiety has been "about the same." She reports some feelings of nervousness. She reports, "I worry about everything." Denies panic attacks. She reports that her sleep is poor. Difficulty falling asleep. She reports some early morning awakenings. She reports that Oliva Bustard is somewhat helpful and seems to be more helpful at times than others. She reports that she has been tired a lot. She describes motivation as up and down. Denies anhedonia. Concentration is impaired. She reports that she is occasionally easily distracted. Denies SI.   She reports that she is no longer working at the [employer]. She and her husband will start working together again soon.   She reports that Xanax prn use varies and she may go several days without needing it.   Past Psychiatric Medication Trials: Cymbalta Trileptal- Insomnia, HA's Lamictal Lithium- Took for a long-time and had diarrhea. Mood was probably better.  Xanax Klonopin Lexapro Prozac Paxil Effexor  Wellbutrin Latuda- "felt drunk" Abilify- allergic reaction Risperdal- Took briefly Ambien- allergic reaction Dayvigo     Review of Systems:  Review of Systems  Musculoskeletal: Negative for gait problem.  Skin: Negative for rash.   Neurological: Negative for tremors.  Psychiatric/Behavioral:       Please refer to HPI   Recent yearly physical exam.   Medications: I have reviewed the patient's current medications.  Current Outpatient Medications  Medication Sig Dispense Refill  . acetaminophen (TYLENOL) 325 MG tablet Take 650 mg by mouth every 6 (six) hours as needed.    Marland Kitchen albuterol (PROVENTIL HFA;VENTOLIN HFA) 108 (90 Base) MCG/ACT inhaler Take 2 puffs every 4 to 6 hour prn wheezing    . ALPRAZolam (XANAX) 1 MG tablet Take 1/2 to 1 po BID prn as needed for anxiety 60 tablet 3  . clonazePAM (KLONOPIN) 0.5 MG tablet Take 1.5 tablets (0.75 mg total) by mouth at bedtime. 45 tablet 2  . DULoxetine (CYMBALTA) 60 MG capsule Take 1 capsule (60 mg total) by mouth daily. 90 capsule 1  . lamoTRIgine (LAMICTAL) 200 MG tablet TAKE 1 TABLET(200 MG) BY MOUTH TWICE DAILY 180 tablet 1  . Lemborexant (DAYVIGO) 5 MG TABS Take 5 mg by mouth at bedtime. (Patient taking differently: Take 5 mg by mouth at bedtime as needed. ) 30 tablet 2  . Lemborexant (DAYVIGO) 5 MG TABS Take 5 mg by mouth at bedtime for 10 days. 10 tablet 0  . levothyroxine (SYNTHROID, LEVOTHROID) 125 MCG tablet Take 125 mcg by mouth.  (Patient not taking: Reported on 01/29/2020)     No current facility-administered medications for this visit.    Medication Side Effects: None  Allergies:  Allergies  Allergen Reactions  . Abilify [Aripiprazole]   . Ambien [Zolpidem]   . Darvon [Propoxyphene]     Darvocet  .  Percocet [Oxycodone-Acetaminophen]     Past Medical History:  Diagnosis Date  . Elevated cholesterol   . Thyroid disease     Family History  Problem Relation Age of Onset  . Depression Father   . Depression Sister   . Depression Brother   . Depression Son     Social History   Socioeconomic History  . Marital status: Married    Spouse name: Not on file  . Number of children: Not on file  . Years of education: Not on file  . Highest education  level: Not on file  Occupational History  . Not on file  Tobacco Use  . Smoking status: Former Games developer  . Smokeless tobacco: Never Used  Substance and Sexual Activity  . Alcohol use: Not on file  . Drug use: Not on file  . Sexual activity: Not on file  Other Topics Concern  . Not on file  Social History Narrative  . Not on file   Social Determinants of Health   Financial Resource Strain:   . Difficulty of Paying Living Expenses: Not on file  Food Insecurity:   . Worried About Programme researcher, broadcasting/film/video in the Last Year: Not on file  . Ran Out of Food in the Last Year: Not on file  Transportation Needs:   . Lack of Transportation (Medical): Not on file  . Lack of Transportation (Non-Medical): Not on file  Physical Activity:   . Days of Exercise per Week: Not on file  . Minutes of Exercise per Session: Not on file  Stress:   . Feeling of Stress : Not on file  Social Connections:   . Frequency of Communication with Friends and Family: Not on file  . Frequency of Social Gatherings with Friends and Family: Not on file  . Attends Religious Services: Not on file  . Active Member of Clubs or Organizations: Not on file  . Attends Banker Meetings: Not on file  . Marital Status: Not on file  Intimate Partner Violence:   . Fear of Current or Ex-Partner: Not on file  . Emotionally Abused: Not on file  . Physically Abused: Not on file  . Sexually Abused: Not on file    Past Medical History, Surgical history, Social history, and Family history were reviewed and updated as appropriate.   Please see review of systems for further details on the patient's review from today.   Objective:   Physical Exam:  There were no vitals taken for this visit.  Physical Exam Constitutional:      General: She is not in acute distress. Musculoskeletal:        General: No deformity.  Neurological:     Mental Status: She is alert and oriented to person, place, and time.     Coordination:  Coordination normal.  Psychiatric:        Attention and Perception: Attention and perception normal. She does not perceive auditory or visual hallucinations.        Mood and Affect: Mood normal. Mood is not anxious or depressed. Affect is not labile, blunt, angry or inappropriate.        Speech: Speech normal.        Behavior: Behavior normal.        Thought Content: Thought content normal. Thought content is not paranoid or delusional. Thought content does not include homicidal or suicidal ideation. Thought content does not include homicidal or suicidal plan.        Cognition and  Memory: Cognition and memory normal.        Judgment: Judgment normal.     Comments: Insight intact     Lab Review:  No results found for: NA, K, CL, CO2, GLUCOSE, BUN, CREATININE, CALCIUM, PROT, ALBUMIN, AST, ALT, ALKPHOS, BILITOT, GFRNONAA, GFRAA  No results found for: WBC, RBC, HGB, HCT, PLT, MCV, MCH, MCHC, RDW, LYMPHSABS, MONOABS, EOSABS, BASOSABS  No results found for: POCLITH, LITHIUM   No results found for: PHENYTOIN, PHENOBARB, VALPROATE, CBMZ   .res Assessment: Plan:   Patient reports that she would like to continue current plan of care without changes.  She reports that she would like to continue further dose reduction of Klonopin since she has been able to reduce Klonopin taking 1 mg nightly to Klonopin 0.75 mg at bedtime. Discussed that she could begin taking Klonopin 0.5 mg 1- 1.5 tabs po QHS and continue to try to reduce usage of Klonopin. Continue Lamictal 200 mg twice daily for mood signs and symptoms. Continue Cymbalta 60 mg daily for depression and anxiety. Continue Xanax as needed for anxiety. Patient to follow-up in 4 months or sooner if clinically indicated. Patient advised to contact office with any questions, adverse effects, or acute worsening in signs and symptoms.  Melony was seen today for follow-up.  Diagnoses and all orders for this visit:  Bipolar II disorder (HCC) -      DULoxetine (CYMBALTA) 60 MG capsule; Take 1 capsule (60 mg total) by mouth daily. -     lamoTRIgine (LAMICTAL) 200 MG tablet; TAKE 1 TABLET(200 MG) BY MOUTH TWICE DAILY     Please see After Visit Summary for patient specific instructions.  Future Appointments  Date Time Provider Department Center  05/27/2020  4:00 PM Corie Chiquito, PMHNP CP-CP None    No orders of the defined types were placed in this encounter.   -------------------------------

## 2020-04-29 ENCOUNTER — Telehealth: Payer: Self-pay | Admitting: Psychiatry

## 2020-04-29 DIAGNOSIS — F5105 Insomnia due to other mental disorder: Secondary | ICD-10-CM

## 2020-04-29 DIAGNOSIS — F422 Mixed obsessional thoughts and acts: Secondary | ICD-10-CM

## 2020-04-29 DIAGNOSIS — F99 Mental disorder, not otherwise specified: Secondary | ICD-10-CM

## 2020-04-29 NOTE — Telephone Encounter (Signed)
Averleigh's next visit is 05/27/20. Requesting refill on Clonazepam and Dayvigo called to [facility] [phone], Phone # [phone]

## 2020-04-30 MED ORDER — DAYVIGO 5 MG PO TABS: 5.0000 mg | ORAL_TABLET | Freq: Every day | ORAL | 2 refills | Status: DC

## 2020-04-30 MED ORDER — CLONAZEPAM 0.5 MG PO TABS: 0.7500 mg | ORAL_TABLET | Freq: Every day | ORAL | 2 refills | Status: DC

## 2020-04-30 NOTE — Telephone Encounter (Signed)
sent 

## 2020-04-30 NOTE — Addendum Note (Signed)
Addended by: Derenda Mis on: 04/30/2020 08:39 AM   Modules accepted: Orders

## 2020-05-27 ENCOUNTER — Ambulatory Visit (INDEPENDENT_AMBULATORY_CARE_PROVIDER_SITE_OTHER): Payer: Self-pay | Admitting: Psychiatry

## 2020-05-27 ENCOUNTER — Other Ambulatory Visit: Payer: Self-pay

## 2020-05-27 ENCOUNTER — Encounter: Payer: Self-pay | Admitting: Psychiatry

## 2020-05-27 DIAGNOSIS — F3181 Bipolar II disorder: Secondary | ICD-10-CM

## 2020-05-27 DIAGNOSIS — F5105 Insomnia due to other mental disorder: Secondary | ICD-10-CM

## 2020-05-27 DIAGNOSIS — F422 Mixed obsessional thoughts and acts: Secondary | ICD-10-CM

## 2020-05-27 DIAGNOSIS — F99 Mental disorder, not otherwise specified: Secondary | ICD-10-CM

## 2020-05-27 MED ORDER — DULOXETINE HCL 60 MG PO CPEP
60.0000 mg | ORAL_CAPSULE | Freq: Every day | ORAL | 1 refills | Status: DC
Start: 2020-05-27 — End: 2020-10-29

## 2020-05-27 MED ORDER — LAMOTRIGINE 200 MG PO TABS: ORAL_TABLET | ORAL | 1 refills | Status: DC

## 2020-05-27 NOTE — Progress Notes (Signed)
Kayla Esparza [id number] [birth year] 60 y.o.  Subjective:   Patient ID:  Kayla Esparza is a 60 y.o. (DOB 1961-01-12) female.  Chief Complaint:  Chief Complaint  Patient presents with  . Follow-up    H/o mood disturbance, anxiety, and insomnia    HPI Kayla Esparza presents to the office today for follow-up of anxiety, mood disturbance, and insomnia. "Just ok." She reports that her energy is very low. Motivation is low. She reports that she has been over-eating. She reports some increased anxiety with marital stress. Some increase in compulsions. Occasional panic attacks. Mood has been depressed. Reports that irritability is only with husband. Denies elevated moods. Denies anhedonia. She reports that she gets hot when she sleeps and this causes sleep disturbance. Sleep has been better with sleeping in another room so she can crack the window and is not disturbed due to husband's multiple blankets and pillows. Concentration has been poor. She has been trying to focus on reading and word games. Denies SI.   [place].   She and her husband went to [place], [place] for 5 weeks with work. Returned to [facility] on 05/02/20.   She reports that most nights she has been taking Klonopin 0.5 mg at bedtime and at times is taking 0.75 mg QHS. She has been taking Alprazolam prn more when she has increased anxiety and elevated HR.   Past Psychiatric Medication Trials: Cymbalta Trileptal- Insomnia, HA's Lamictal Lithium- Took for a long-time and had diarrhea. Mood was probably better.  Xanax Klonopin Lexapro Prozac Paxil Effexor  Wellbutrin Latuda- "felt drunk" Abilify- allergic reaction Risperdal- Took briefly Ambien- allergic reaction Dayvigo    Review of Systems:  Review of Systems  Constitutional: Positive for diaphoresis.  Gastrointestinal: Negative.   Musculoskeletal: Negative for gait problem.       Pulled muscle in side  Neurological: Negative for  tremors.  Psychiatric/Behavioral:       Please refer to HPI    Has apt with PCP for labs.   Medications: I have reviewed the patient's current medications.  Current Outpatient Medications  Medication Sig Dispense Refill  . acetaminophen (TYLENOL) 325 MG tablet Take 650 mg by mouth every 6 (six) hours as needed.    . ALPRAZolam (XANAX) 1 MG tablet Take 1/2 to 1 po BID prn as needed for anxiety 60 tablet 3  . clonazePAM (KLONOPIN) 0.5 MG tablet Take 1.5 tablets (0.75 mg total) by mouth at bedtime. 45 tablet 2  . Lemborexant (DAYVIGO) 5 MG TABS Take 5 mg by mouth at bedtime. 30 tablet 2  . levothyroxine (SYNTHROID, LEVOTHROID) 125 MCG tablet Take 125 mcg by mouth.    Marland Kitchen albuterol (PROVENTIL HFA;VENTOLIN HFA) 108 (90 Base) MCG/ACT inhaler Take 2 puffs every 4 to 6 hour prn wheezing    . DULoxetine (CYMBALTA) 60 MG capsule Take 1 capsule (60 mg total) by mouth daily. 90 capsule 1  . lamoTRIgine (LAMICTAL) 200 MG tablet TAKE 1 TABLET(200 MG) BY MOUTH TWICE DAILY 180 tablet 1   No current facility-administered medications for this visit.    Medication Side Effects: None  Allergies:  Allergies  Allergen Reactions  . Abilify [Aripiprazole]   . Ambien [Zolpidem]   . Darvon [Propoxyphene]     Darvocet  . Percocet [Oxycodone-Acetaminophen]     Past Medical History:  Diagnosis Date  . Elevated cholesterol   . Thyroid disease     Family History  Problem Relation Age of  Onset  . Depression Father   . Depression Sister   . Depression Brother   . Depression Son     Social History   Socioeconomic History  . Marital status: Married    Spouse name: Not on file  . Number of children: Not on file  . Years of education: Not on file  . Highest education level: Not on file  Occupational History  . Not on file  Tobacco Use  . Smoking status: Former Games developer  . Smokeless tobacco: Never Used  Substance and Sexual Activity  . Alcohol use: Not on file  . Drug use: Not on file  .  Sexual activity: Not on file  Other Topics Concern  . Not on file  Social History Narrative  . Not on file   Social Determinants of Health   Financial Resource Strain: Not on file  Food Insecurity: Not on file  Transportation Needs: Not on file  Physical Activity: Not on file  Stress: Not on file  Social Connections: Not on file  Intimate Partner Violence: Not on file    Past Medical History, Surgical history, Social history, and Family history were reviewed and updated as appropriate.   Please see review of systems for further details on the patient's review from today.   Objective:   Physical Exam:  There were no vitals taken for this visit.  Physical Exam Constitutional:      General: She is not in acute distress. Musculoskeletal:        General: No deformity.  Neurological:     Mental Status: She is alert and oriented to person, place, and time.     Coordination: Coordination normal.  Psychiatric:        Attention and Perception: Attention and perception normal. She does not perceive auditory or visual hallucinations.        Mood and Affect: Affect is not labile, blunt, angry or inappropriate.        Speech: Speech normal.        Behavior: Behavior normal.        Thought Content: Thought content normal. Thought content is not paranoid or delusional. Thought content does not include homicidal or suicidal ideation. Thought content does not include homicidal or suicidal plan.        Cognition and Memory: Cognition and memory normal.        Judgment: Judgment normal.     Comments: Mood is appropriate to content. Affect is congruent     Lab Review:  No results found for: NA, K, CL, CO2, GLUCOSE, BUN, CREATININE, CALCIUM, PROT, ALBUMIN, AST, ALT, ALKPHOS, BILITOT, GFRNONAA, GFRAA  No results found for: WBC, RBC, HGB, HCT, PLT, MCV, MCH, MCHC, RDW, LYMPHSABS, MONOABS, EOSABS, BASOSABS  No results found for: POCLITH, LITHIUM   No results found for: PHENYTOIN,  PHENOBARB, VALPROATE, CBMZ   .res Assessment: Plan:   Will continue current plan of care since target signs and symptoms are well controlled without any tolerability issues. Continue Cymbalta 60 mg daily for mood and anxiety.  Continue Lamictal 200 mg po BID for mood s/s. Continue Klonopin 0.5 mg 1-1.5 tabs po QHS for insomnia. Pt has refills remaining.  Continue Xanax prn anxiety. Pt has refills remaining. Continue Dayvigo 5 mg po QHS for insomnia. Pt has refills remaining.  Pt to follow-up in 4 months or sooner if clinically indicated. Patient advised to contact office with any questions, adverse effects, or acute worsening in signs and symptoms.    Mischelle was  seen today for follow-up.  Diagnoses and all orders for this visit:  Mixed obsessional thoughts and acts  Bipolar II disorder (HCC) -     DULoxetine (CYMBALTA) 60 MG capsule; Take 1 capsule (60 mg total) by mouth daily. -     lamoTRIgine (LAMICTAL) 200 MG tablet; TAKE 1 TABLET(200 MG) BY MOUTH TWICE DAILY  Insomnia due to other mental disorder     Please see After Visit Summary for patient specific instructions.  Future Appointments  Date Time Provider Department Center  09/24/2020 10:00 AM Corie Chiquito, PMHNP [facility] None    No orders of the defined types were placed in this encounter.   -------------------------------

## 2020-06-05 ENCOUNTER — Telehealth: Payer: Self-pay | Admitting: Psychiatry

## 2020-06-05 DIAGNOSIS — F5105 Insomnia due to other mental disorder: Secondary | ICD-10-CM

## 2020-06-05 DIAGNOSIS — F422 Mixed obsessional thoughts and acts: Secondary | ICD-10-CM

## 2020-06-05 DIAGNOSIS — F99 Mental disorder, not otherwise specified: Secondary | ICD-10-CM

## 2020-06-05 MED ORDER — ALPRAZOLAM 1 MG PO TABS: ORAL_TABLET | ORAL | 3 refills | Status: DC

## 2020-06-05 MED ORDER — CLONAZEPAM 0.5 MG PO TABS: 0.7500 mg | ORAL_TABLET | Freq: Every day | ORAL | 2 refills | Status: DC

## 2020-06-05 MED ORDER — DAYVIGO 5 MG PO TABS: 5.0000 mg | ORAL_TABLET | Freq: Every day | ORAL | 2 refills | Status: DC

## 2020-06-05 NOTE — Telephone Encounter (Signed)
Kayla Esparza called and tried to get her Alprazolam and Clonazepam filled but [facility] said it cant be filled again. [facility] number is [phone].

## 2020-06-05 NOTE — Telephone Encounter (Signed)
Scripts re-sent since Epic was not working properly when she was seen.

## 2020-06-22 ENCOUNTER — Telehealth: Payer: Self-pay

## 2020-06-22 DIAGNOSIS — F5105 Insomnia due to other mental disorder: Secondary | ICD-10-CM

## 2020-06-22 DIAGNOSIS — F99 Mental disorder, not otherwise specified: Secondary | ICD-10-CM

## 2020-06-22 NOTE — Telephone Encounter (Signed)
Prior authorization submitted for DAYVIGO 5 MG with [employer], DENIAL received due to patient required to try ALL formularies, such as Lunesta, Sonata, Ambien (pt tried) Also reports patient needs to check with her formularies listed in her plan.   Will update Jessica with response and how to proceed.

## 2020-06-23 MED ORDER — TRAZODONE HCL 100 MG PO TABS: ORAL_TABLET | ORAL | 1 refills | Status: DC

## 2020-06-23 NOTE — Telephone Encounter (Signed)
Attempted to reach pt to discuss alternatives to [facility] since insurance recently denied Dayvigo. Unable to reach pt and voicemail is full. Will send in script for Trazodone for insomnia to replace Dayvigo.

## 2020-06-23 NOTE — Telephone Encounter (Signed)
Patient updated with new Rx for Trazodone and discussed side effects. Advised her to call back with any issues.

## 2020-06-23 NOTE — Telephone Encounter (Signed)
Pt says she has not tried any other medications.

## 2020-08-12 ENCOUNTER — Telehealth: Payer: Self-pay | Admitting: Psychiatry

## 2020-08-12 NOTE — Telephone Encounter (Signed)
Pt left a message stating that she went to her PCP today and her doctor feels like  She is over medicated. Please give her a call back at [phone]

## 2020-08-12 NOTE — Telephone Encounter (Signed)
Rtc to pt and she reported to her PCP that she has brain fogs,scattered thoughts,trouble focusing and memory loss.Her PCP informed her she may need a detox and believes she is taking too much medication.Pt stated she is fine but just wanted you to be aware of that.

## 2020-08-14 NOTE — Telephone Encounter (Signed)
It looks like Trazodone may have caused some side effects. Has she stopped Trazodone? Will review further during upcoming visit.

## 2020-08-18 NOTE — Telephone Encounter (Signed)
Just FYI I have been trying to reach this pt but she has not returned my calls.

## 2020-09-24 ENCOUNTER — Ambulatory Visit: Payer: Self-pay | Admitting: Psychiatry

## 2020-09-25 ENCOUNTER — Other Ambulatory Visit: Payer: Self-pay

## 2020-09-25 ENCOUNTER — Telehealth: Payer: Self-pay | Admitting: Psychiatry

## 2020-09-25 DIAGNOSIS — F5105 Insomnia due to other mental disorder: Secondary | ICD-10-CM

## 2020-09-25 MED ORDER — TRAZODONE HCL 100 MG PO TABS: ORAL_TABLET | ORAL | 1 refills | Status: DC

## 2020-09-25 NOTE — Telephone Encounter (Signed)
Rx sent 

## 2020-09-25 NOTE — Telephone Encounter (Signed)
Next visit is 10/29/20. Requesting refill on Trazodone called to:  [facility] [phone] - [place], [place] - [address] AT [facility] [address] [facility] Phone:  [phone]  Fax:  [phone]

## 2020-10-29 ENCOUNTER — Ambulatory Visit (INDEPENDENT_AMBULATORY_CARE_PROVIDER_SITE_OTHER): Payer: Self-pay | Admitting: Psychiatry

## 2020-10-29 ENCOUNTER — Other Ambulatory Visit: Payer: Self-pay

## 2020-10-29 ENCOUNTER — Encounter: Payer: Self-pay | Admitting: Psychiatry

## 2020-10-29 DIAGNOSIS — F3181 Bipolar II disorder: Secondary | ICD-10-CM

## 2020-10-29 DIAGNOSIS — F5105 Insomnia due to other mental disorder: Secondary | ICD-10-CM

## 2020-10-29 DIAGNOSIS — F99 Mental disorder, not otherwise specified: Secondary | ICD-10-CM

## 2020-10-29 MED ORDER — LAMOTRIGINE 200 MG PO TABS: ORAL_TABLET | ORAL | 1 refills | Status: DC

## 2020-10-29 MED ORDER — TRAZODONE HCL 100 MG PO TABS: ORAL_TABLET | ORAL | 1 refills | Status: DC

## 2020-10-29 MED ORDER — DULOXETINE HCL 60 MG PO CPEP: 60.0000 mg | ORAL_CAPSULE | Freq: Every day | ORAL | 1 refills | Status: DC

## 2020-10-29 NOTE — Progress Notes (Signed)
Kayla Esparza [id number] Apr 27, 1961 60 y.o.  Subjective:   Patient ID:  Kayla Esparza is a 60 y.o. (DOB 08-Nov-1960) female.  Chief Complaint:  Chief Complaint  Patient presents with   Follow-up    History of depression, anxiety, and insomnia    HPI Micheale Spackman presents to the office today for follow-up of mood disturbance, anxiety, and insomnia. She reports that her mood has improved. She reports that she has been getting up early. She reports sleeping well. She reports that she stopped Klonopin and Xanax. She reports that she went through [facility] to come off medication. Denies taking more than prescribed of benzodiazepines- "just didn't feel right." She reports that she has some anxiety that has been manageable. Rare panic attacks. Energy and motivation have been ok. She reports concentration is "not horrible, not great." Denies any manic s/s. Denies SI.   They had one show in April and another in July. She has been working at the [employer] and is enjoying this. She reports that things have been going ok at home.     Past Psychiatric Medication Trials: Cymbalta Trileptal- Insomnia, HA's Lamictal Lithium- Took for a long-time and had diarrhea. Mood was probably better. Xanax Klonopin Lexapro Prozac Paxil Effexor Wellbutrin Latuda- "felt drunk" Abilify- allergic reaction Risperdal- Took briefly Ambien- allergic reaction Dayvigo  Review of Systems:  Review of Systems  Eyes:  Positive for visual disturbance.       Reports cataracts and eye strain  Musculoskeletal:  Negative for gait problem.  Skin:  Negative for rash.  Neurological:  Positive for headaches. Negative for tremors.  Psychiatric/Behavioral:         Please refer to HPI   Medications: I have reviewed the patient's current medications.  Current Outpatient Medications  Medication Sig Dispense Refill   acetaminophen (TYLENOL) 325 MG tablet Take 650 mg by mouth every 6 (six) hours as needed.     albuterol (PROVENTIL  HFA;VENTOLIN HFA) 108 (90 Base) MCG/ACT inhaler Take 2 puffs every 4 to 6 hour prn wheezing     DULoxetine (CYMBALTA) 60 MG capsule Take 1 capsule (60 mg total) by mouth daily. 90 capsule 1   lamoTRIgine (LAMICTAL) 200 MG tablet TAKE 1 TABLET(200 MG) BY MOUTH TWICE DAILY 180 tablet 1   levothyroxine (SYNTHROID, LEVOTHROID) 125 MCG tablet Take 125 mcg by mouth. (Patient not taking: Reported on 10/29/2020)     traZODone (DESYREL) 100 MG tablet Take 1/2-1 tablet po QHS prn insomnia 90 tablet 1   No current facility-administered medications for this visit.    Medication Side Effects: None  Allergies:  Allergies  Allergen Reactions   Abilify [Aripiprazole]    Ambien [Zolpidem]    Darvon [Propoxyphene]     Darvocet   Percocet [Oxycodone-Acetaminophen]     Past Medical History:  Diagnosis Date   Elevated cholesterol    Thyroid disease     Past Medical History, Surgical history, Social history, and Family history were reviewed and updated as appropriate.   Please see review of systems for further details on the patient's review from today.   Objective:   Physical Exam:  There were no vitals taken for this visit.  Physical Exam Constitutional:      General: She is not in acute distress. Musculoskeletal:        General: No deformity.  Neurological:     Mental Status: She is alert and oriented to person, place, and time.     Coordination: Coordination normal.  Psychiatric:  Attention and Perception: Attention and perception normal. She does not perceive auditory or visual hallucinations.        Mood and Affect: Mood normal. Mood is not anxious or depressed. Affect is not labile, blunt, angry or inappropriate.        Speech: Speech normal.        Behavior: Behavior normal.        Thought Content: Thought content normal. Thought content is not paranoid or delusional. Thought content does not include homicidal or suicidal ideation. Thought content does not include homicidal or  suicidal plan.        Cognition and Memory: Cognition and memory normal.        Judgment: Judgment normal.     Comments: Insight intact    Lab Review:  No results found for: NA, K, CL, CO2, GLUCOSE, BUN, CREATININE, CALCIUM, PROT, ALBUMIN, AST, ALT, ALKPHOS, BILITOT, GFRNONAA, GFRAA  No results found for: WBC, RBC, HGB, HCT, PLT, MCV, MCH, MCHC, RDW, LYMPHSABS, MONOABS, EOSABS, BASOSABS  No results found for: POCLITH, LITHIUM   No results found for: PHENYTOIN, PHENOBARB, VALPROATE, CBMZ   .res Assessment: Plan:   Will continue current plan of care since target signs and symptoms are well controlled without any tolerability issues. Patient to follow-up in 6 months or sooner if clinically indicated. Patient advised to contact office with any questions, adverse effects, or acute worsening in signs and symptoms.  Renlee was seen today for follow-up.  Diagnoses and all orders for this visit:  Bipolar II disorder (HCC) -     DULoxetine (CYMBALTA) 60 MG capsule; Take 1 capsule (60 mg total) by mouth daily. -     lamoTRIgine (LAMICTAL) 200 MG tablet; TAKE 1 TABLET(200 MG) BY MOUTH TWICE DAILY  Insomnia due to other mental disorder -     traZODone (DESYREL) 100 MG tablet; Take 1/2-1 tablet po QHS prn insomnia    Please see After Visit Summary for patient specific instructions.  Future Appointments  Date Time Provider Department Center  05/13/2021 12:45 PM Corie Chiquito, PMHNP CP-CP None    No orders of the defined types were placed in this encounter.   -------------------------------

## 2021-05-13 ENCOUNTER — Ambulatory Visit: Payer: Self-pay | Admitting: Psychiatry

## 2021-05-19 ENCOUNTER — Other Ambulatory Visit: Payer: Self-pay

## 2021-05-19 DIAGNOSIS — F3181 Bipolar II disorder: Secondary | ICD-10-CM

## 2021-05-19 MED ORDER — LAMOTRIGINE 200 MG PO TABS: ORAL_TABLET | ORAL | 0 refills | Status: DC

## 2021-06-18 ENCOUNTER — Other Ambulatory Visit: Payer: Self-pay

## 2021-06-18 ENCOUNTER — Encounter: Payer: Self-pay | Admitting: Psychiatry

## 2021-06-18 ENCOUNTER — Ambulatory Visit: Payer: Self-pay | Admitting: Psychiatry

## 2021-06-18 VITALS — BP 130/75 | HR 72

## 2021-06-18 DIAGNOSIS — F5105 Insomnia due to other mental disorder: Secondary | ICD-10-CM

## 2021-06-18 DIAGNOSIS — F3181 Bipolar II disorder: Secondary | ICD-10-CM

## 2021-06-18 DIAGNOSIS — F422 Mixed obsessional thoughts and acts: Secondary | ICD-10-CM

## 2021-06-18 DIAGNOSIS — F99 Mental disorder, not otherwise specified: Secondary | ICD-10-CM

## 2021-06-18 MED ORDER — LAMOTRIGINE 200 MG PO TABS: ORAL_TABLET | ORAL | 0 refills | Status: DC

## 2021-06-18 MED ORDER — DULOXETINE HCL 60 MG PO CPEP: 60.0000 mg | ORAL_CAPSULE | Freq: Every day | ORAL | 1 refills | Status: DC

## 2021-06-18 MED ORDER — ALPRAZOLAM 1 MG PO TABS: ORAL_TABLET | ORAL | 1 refills | Status: DC

## 2021-06-18 MED ORDER — PROPRANOLOL HCL 10 MG PO TABS: ORAL_TABLET | ORAL | 1 refills | Status: DC

## 2021-06-18 NOTE — Progress Notes (Signed)
Kayla Esparza [id number] February 14, 1961 61 y.o.  Subjective:   Patient ID:  Kayla Esparza is a 61 y.o. (DOB October 26, 1960) female.  Chief Complaint:  Chief Complaint  Patient presents with   Anxiety   Panic Attack   Insomnia    HPI Kayla Esparza presents to the office today for follow-up of anxiety, panic, insomnia, and depression. Kayla Esparza reports that Kayla Esparza was doing well "until the last few days." Kayla Esparza reports that Kayla Esparza has been having severe anxiety and has increased BP. Kayla Esparza reports that her PCP thinks that elevated BP is related to anxiety. Kayla Esparza was seen in ER on 06/02/21 for chest pain and reports that work-up was negative and has a stress test scheduled. Kayla Esparza reports that this felt different compared to panic attacks Kayla Esparza has had in the past. Kayla Esparza reports feeling nervous and "I just shake." Kayla Esparza reports catastrophic thinking and intrusive thoughts. Kayla Esparza reports rumination. Denies compulsions. Kayla Esparza reports that being in crowds and public causes her anxiety and Kayla Esparza avoids grocery or clothes shopping. Kayla Esparza reports that Kayla Esparza had high anxiety with sweating when Kayla Esparza had to ride with her husband to a bigger city with traffic.   Kayla Esparza reports poor sleep with difficulty falling and staying asleep. Kayla Esparza reports "when I do get to sleep my brain is on overdrive." Kayla Esparza describes at times feeling like Kayla Esparza is not sure if Kayla Esparza is asleep or awake.   Kayla Esparza reports that her husband has said Kayla Esparza is irritable and that Kayla Esparza is constantly c/o feeling either hot or cold. Kayla Esparza reports that Kayla Esparza may "over-react" when he is being critical. Kayla Esparza reports sad mood with worsening over the last few days. Kayla Esparza reports that Kayla Esparza has been in bed the last 2 days. Low energy and low motivation. Decreased interest in things. Kayla Esparza reports that Kayla Esparza is "eating too much." Concentration has been ok.  Kayla Esparza reports that Kayla Esparza has had passive death wishes. Denies SI. Denies any recent manic s/s.   "I probably need to leave my husband, but I can't." Kayla Esparza reports that they have been at  home more and working less because it is there slower season. Kayla Esparza reports that Kayla Esparza has some supports "but I can't talk about it."    Past Psychiatric Medication Trials: Cymbalta Trileptal- Insomnia, HA's Lamictal Lithium- Took for a long-time and had diarrhea. Mood was probably better. Xanax Klonopin Lexapro Prozac Paxil Effexor Wellbutrin Latuda- "felt drunk" Abilify- allergic reaction Risperdal- Took briefly Ambien- allergic reaction Dayvigo Trazodone- Ineffective, dry mouth, congestion  Review of Systems:  Review of Systems  Cardiovascular:        Mild chest pain- improved since ER visit  Endocrine:       Kayla Esparza reports thyroid levels are WNL. Kayla Esparza reports heat and cold intolerance  Musculoskeletal:  Negative for gait problem.  Neurological:  Negative for tremors.  Psychiatric/Behavioral:         Please refer to HPI   Medications: I have reviewed the patient's current medications.  Current Outpatient Medications  Medication Sig Dispense Refill   losartan-hydrochlorothiazide (HYZAAR) 50-12.5 MG tablet Take 1 tablet by mouth daily.     propranolol (INDERAL) 10 MG tablet Take 1-2 tabs po BID prn anxiety 120 tablet 1   albuterol (PROVENTIL HFA;VENTOLIN HFA) 108 (90 Base) MCG/ACT inhaler Take 2 puffs every 4 to 6 hour prn wheezing     ALPRAZolam (XANAX) 1 MG tablet Take 1/2 to 1 po BID prn as needed for anxiety 60 tablet 1   DULoxetine (CYMBALTA) 60 MG capsule  Take 1 capsule (60 mg total) by mouth daily. 90 capsule 1   lamoTRIgine (LAMICTAL) 200 MG tablet TAKE 1 TABLET(200 MG) BY MOUTH TWICE DAILY 180 tablet 0   levothyroxine (SYNTHROID, LEVOTHROID) 125 MCG tablet Take 125 mcg by mouth. (Patient not taking: Reported on 10/29/2020)     No current facility-administered medications for this visit.    Medication Side Effects: None  Allergies:  Allergies  Allergen Reactions   Abilify [Aripiprazole]    Ambien [Zolpidem]    Darvon [Propoxyphene]     Darvocet   Percocet  [Oxycodone-Acetaminophen]     Past Medical History:  Diagnosis Date   Elevated cholesterol    Thyroid disease     Past Medical History, Surgical history, Social history, and Family history were reviewed and updated as appropriate.   Please see review of systems for further details on the patient's review from today.   Objective:   Physical Exam:  BP 130/75    Pulse 72   Physical Exam Constitutional:      General: Kayla Esparza is not in acute distress. Musculoskeletal:        General: No deformity.  Neurological:     Mental Status: Kayla Esparza is alert and oriented to person, place, and time.     Coordination: Coordination normal.  Psychiatric:        Attention and Perception: Attention and perception normal. Kayla Esparza does not perceive auditory or visual hallucinations.        Mood and Affect: Mood is anxious and depressed. Affect is tearful. Affect is not labile, blunt, angry or inappropriate.        Speech: Speech normal.        Behavior: Behavior normal. Behavior is cooperative.        Thought Content: Thought content normal. Thought content is not paranoid or delusional. Thought content does not include homicidal or suicidal ideation. Thought content does not include homicidal or suicidal plan.        Cognition and Memory: Cognition and memory normal.        Judgment: Judgment normal.     Comments: Insight intact    Lab Review:  No results found for: NA, K, CL, CO2, GLUCOSE, BUN, CREATININE, CALCIUM, PROT, ALBUMIN, AST, ALT, ALKPHOS, BILITOT, GFRNONAA, GFRAA  No results found for: WBC, RBC, HGB, HCT, PLT, MCV, MCH, MCHC, RDW, LYMPHSABS, MONOABS, EOSABS, BASOSABS  No results found for: POCLITH, LITHIUM   No results found for: PHENYTOIN, PHENOBARB, VALPROATE, CBMZ   .res Assessment: Plan:    Discussed her concerns about insurance possibly no longer covering Cymbalta since Kayla Esparza feels that this medication has been helpful for her mood and anxiety. Discussed cash prices with [facility] if  insurance no longer covers Cymbalta and pt reports that Kayla Esparza would prefer to pay cash price than switch medication. Discussed that insurance may cover with PA approval and to request that pharmacy send PA info to office if needed.  Will continue Cymbalta 60 mg po qd for anxiety and depression.  Will re-start Xanax for severe anxiety, panic, and insomnia. Discussed taking 1 tablet twice daily for the next 3-5 days to stabilize acute anxiety and then can start using as needed.  Continue Lamictal 200 mg po BID for mood s/s.  Continue Propranolol 10 mg 1-2 tabs po BID prn anxiety.  Discussed considering resuming therapy. Pt reports that therapy would likely be beneficial, however Kayla Esparza is concerned about cost. Provided referrals for organizations that may offer therapy on a sliding scale since pt  does not have insurance. Pt to follow-up with this provider in 6 weeks or sooner if clinically indicated. Discussed that follow-up apt could be pushed further out if s/s have improved and Kayla Esparza prefers to follow-up in several months instead due to financial constraints.  Patient advised to contact office with any questions, adverse effects, or acute worsening in signs and symptoms.   Laurelai was seen today for anxiety, panic attack and insomnia.  Diagnoses and all orders for this visit:  Insomnia due to other mental disorder -     propranolol (INDERAL) 10 MG tablet; Take 1-2 tabs po BID prn anxiety  Mixed obsessional thoughts and acts -     ALPRAZolam (XANAX) 1 MG tablet; Take 1/2 to 1 po BID prn as needed for anxiety  Bipolar II disorder (HCC) -     DULoxetine (CYMBALTA) 60 MG capsule; Take 1 capsule (60 mg total) by mouth daily. -     lamoTRIgine (LAMICTAL) 200 MG tablet; TAKE 1 TABLET(200 MG) BY MOUTH TWICE DAILY     Please see After Visit Summary for patient specific instructions.  Future Appointments  Date Time Provider Department Center  07/30/2021 11:15 AM Corie Chiquito, PMHNP CP-CP None    No  orders of the defined types were placed in this encounter.   -------------------------------

## 2021-07-30 ENCOUNTER — Ambulatory Visit: Payer: Self-pay | Admitting: Psychiatry

## 2021-08-04 ENCOUNTER — Other Ambulatory Visit: Payer: Self-pay

## 2021-08-04 DIAGNOSIS — F3181 Bipolar II disorder: Secondary | ICD-10-CM

## 2021-08-04 MED ORDER — LAMOTRIGINE 200 MG PO TABS: ORAL_TABLET | ORAL | 0 refills | Status: DC

## 2021-08-10 ENCOUNTER — Ambulatory Visit (INDEPENDENT_AMBULATORY_CARE_PROVIDER_SITE_OTHER): Payer: Self-pay | Admitting: Psychiatry

## 2021-08-10 ENCOUNTER — Encounter: Payer: Self-pay | Admitting: Psychiatry

## 2021-08-10 DIAGNOSIS — F3181 Bipolar II disorder: Secondary | ICD-10-CM

## 2021-08-10 DIAGNOSIS — F99 Mental disorder, not otherwise specified: Secondary | ICD-10-CM

## 2021-08-10 DIAGNOSIS — F5105 Insomnia due to other mental disorder: Secondary | ICD-10-CM

## 2021-08-10 MED ORDER — PROPRANOLOL HCL 10 MG PO TABS: ORAL_TABLET | ORAL | 1 refills | Status: DC

## 2021-08-10 MED ORDER — DOXEPIN HCL 10 MG PO CAPS: 10.0000 mg | ORAL_CAPSULE | Freq: Every evening | ORAL | 2 refills | Status: DC | PRN

## 2021-08-10 MED ORDER — DULOXETINE HCL 60 MG PO CPEP: 60.0000 mg | ORAL_CAPSULE | Freq: Every day | ORAL | 1 refills | Status: DC

## 2021-08-10 NOTE — Progress Notes (Signed)
Kayla Esparza ?[id number] ?Dec 29, 1960 ?61 y.o. ? ?Subjective:  ? ?Patient ID:  Kayla Esparza is a 61 y.o. (DOB Aug 04, 1960) female. ? ?Chief Complaint:  ?Chief Complaint  ?Patient presents with  ? Sleeping Problem  ? Follow-up  ?  Anxiety, depression  ? ? ?HPI ?Kayla Esparza presents to the office today for follow-up of depression, anxiety, and insomnia. Mood has improved some. She reports that she continues to have some irritability with husband. Denies irritability with others. She reports that she had some anxiety when she first started her new job and "wanting to do everything right." Anxiety has improved at work now that she feels more comfortable. She reports that her anxiety is "better."  ? ?Has been trying to lose weight. She has been eating healthier. Has been thinking about being more active. Energy and motivation have been ok. She reports some difficulty with memory and concentration. Denies SI.  ? ?She reports that she has distressing dreams and will wake up and return to the same dream. She reports that she has some nightmares about events from her past. She reports that at times she does not want to return to sleep because of nightmares. She reports nightmares are long-standing. She reports difficulty falling asleep. Husband has commented that she will talk and act things out in sleep. ? 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  ? ?She reports that she has"gotten back on track with my spiritual life." ?  ?Past Psychiatric Medication Trials: ?Cymbalta ?Trileptal- Insomnia, HA's ?Lamictal ?Lithium- Took for a long-time and had diarrhea. Mood was probably better. ?Xanax ?Klonopin ?Lexapro ?Prozac ?Paxil ?Effexor ?Wellbutrin ?Latuda- "felt drunk" ?Abilify- allergic reaction ?Risperdal- Took briefly ?Ambien- allergic reaction ?Dayvigo ?Trazodone- Ineffective, dry mouth, congestion ? ?Review of Systems:  ?Review of Systems  ?Gastrointestinal:  Positive for abdominal pain.   ?Musculoskeletal:  Negative for gait problem.  ?Psychiatric/Behavioral:    ?     Please refer to HPI  ? ?Medications: I have reviewed the patient's current medications. ? ?Current Outpatient Medications  ?Medication Sig Dispense Refill  ? ALPRAZolam (XANAX) 1 MG tablet Take 1/2 to 1 po BID prn as needed for anxiety 60 tablet 1  ? doxepin (SINEQUAN) 10 MG capsule Take 1 capsule (10 mg total) by mouth at bedtime as needed. 30 capsule 2  ? lamoTRIgine (LAMICTAL) 200 MG tablet TAKE 1 TABLET(200 MG) BY MOUTH TWICE DAILY 180 tablet 0  ? losartan-hydrochlorothiazide (HYZAAR) 50-12.5 MG tablet Take 1 tablet by mouth daily.    ? albuterol (PROVENTIL HFA;VENTOLIN HFA) 108 (90 Base) MCG/ACT inhaler Take 2 puffs every 4 to 6 hour prn wheezing    ? DULoxetine (CYMBALTA) 60 MG capsule Take 1 capsule (60 mg total) by mouth daily. 90 capsule 1  ? propranolol (INDERAL) 10 MG tablet Take 1-2 tabs po BID prn anxiety 120 tablet 1  ? ?No current facility-administered medications for this visit.  ? ? ?Medication Side Effects: None ? ?Allergies:  ?Allergies  ?Allergen Reactions  ? Abilify [Aripiprazole]   ? Ambien [Zolpidem]   ? Darvon [Propoxyphene]   ?  Darvocet  ? Percocet [Oxycodone-Acetaminophen]   ? ? ?Past Medical History:  ?Diagnosis Date  ? Elevated cholesterol   ? Thyroid disease   ? ? ?Past Medical History, Surgical history, Social history, and Family history were reviewed and updated as appropriate.  ? ?Please see review of systems for further details on the patient's review from today.  ? ?Objective:  ? ?  Physical Exam:  ?BP 105/64   Pulse 87   Wt 203 lb (92.1 kg)   BMI 32.77 kg/m?  ? ?Physical Exam ?Constitutional:   ?   General: She is not in acute distress. ?Musculoskeletal:     ?   General: No deformity.  ?Neurological:  ?   Mental Status: She is alert and oriented to person, place, and time.  ?   Coordination: Coordination normal.  ?Psychiatric:     ?   Attention and Perception: Attention and perception normal. She  does not perceive auditory or visual hallucinations.     ?   Mood and Affect: Affect is not labile, blunt, angry or inappropriate.     ?   Speech: Speech normal.     ?   Behavior: Behavior normal.     ?   Thought Content: Thought content normal. Thought content is not paranoid or delusional. Thought content does not include homicidal or suicidal ideation. Thought content does not include homicidal or suicidal plan.     ?   Cognition and Memory: Cognition and memory normal.     ?   Judgment: Judgment normal.  ?   Comments: Insight intact ?Improved mood and anxiety  ? ? ?Lab Review:  ?No results found for: NA, K, CL, CO2, GLUCOSE, BUN, CREATININE, CALCIUM, PROT, ALBUMIN, AST, ALT, ALKPHOS, BILITOT, GFRNONAA, GFRAA ? ?No results found for: WBC, RBC, HGB, HCT, PLT, MCV, MCH, MCHC, RDW, LYMPHSABS, MONOABS, EOSABS, BASOSABS ? ?No results found for: POCLITH, LITHIUM  ? ?No results found for: PHENYTOIN, PHENOBARB, VALPROATE, CBMZ  ? ?.res ?Assessment: Plan:   ?Will start Doxepin 10 mg po QHS prn insomnia.  ?Will re-send Propranolol prn anxiety since she reports that pharmacy reports they did not receive it.  ?Will continue Cymbalta 60 mg po qd for anxiety and depression.  ?Continue Lamictal 200 mg po qd for mood s/s. ?Continue Xanax 1 mg 1/2-1 tab po BID prn anxiety.  ?Pt to follow-up in 6-8 weeks or sooner if clinically indicated.  ?Patient advised to contact office with any questions, adverse effects, or acute worsening in signs and symptoms. ? ?[name] was seen today for sleeping problem and follow-up. ? ?Diagnoses and all orders for this visit: ? ?Insomnia due to other mental disorder ?-     propranolol (INDERAL) 10 MG tablet; Take 1-2 tabs po BID prn anxiety ?-     doxepin (SINEQUAN) 10 MG capsule; Take 1 capsule (10 mg total) by mouth at bedtime as needed. ? ?Bipolar II disorder ([place]) ?-     DULoxetine (CYMBALTA) 60 MG capsule; Take 1 capsule (60 mg total) by mouth daily. ? ?  ? ?Please see After Visit Summary for patient  specific instructions. ? ?Future Appointments  ?Date Time Provider [place]  ?11/02/2021 10:30 AM Thayer Headings, PMHNP CP-CP None  ? ? ? ?No orders of the defined types were placed in this encounter. ? ? ?------------------------------- ?

## 2021-08-23 ENCOUNTER — Other Ambulatory Visit: Payer: Self-pay

## 2021-08-23 DIAGNOSIS — F5105 Insomnia due to other mental disorder: Secondary | ICD-10-CM

## 2021-08-23 MED ORDER — PROPRANOLOL HCL 10 MG PO TABS: ORAL_TABLET | ORAL | 0 refills | Status: DC

## 2021-10-21 ENCOUNTER — Other Ambulatory Visit: Payer: Self-pay

## 2021-10-21 ENCOUNTER — Telehealth: Payer: Self-pay | Admitting: Psychiatry

## 2021-10-21 DIAGNOSIS — F422 Mixed obsessional thoughts and acts: Secondary | ICD-10-CM

## 2021-10-21 MED ORDER — ALPRAZOLAM 1 MG PO TABS: ORAL_TABLET | ORAL | 1 refills | Status: DC

## 2021-10-21 NOTE — Telephone Encounter (Signed)
Pended.

## 2021-10-21 NOTE — Telephone Encounter (Signed)
Pt LVM @ 10:12a.  She would like refill of Alprazolam sent to   [facility] [id number] - [place], [place] - [address] AT [facility] OF [facility] [address]  [address] [place], [place] [address]  Phone:  [phone]  Fax:  [phone]   Next appt 6/26

## 2021-11-02 ENCOUNTER — Ambulatory Visit: Payer: Self-pay | Admitting: Psychiatry

## 2021-11-15 ENCOUNTER — Other Ambulatory Visit: Payer: Self-pay

## 2021-11-15 DIAGNOSIS — F3181 Bipolar II disorder: Secondary | ICD-10-CM

## 2021-11-15 MED ORDER — LAMOTRIGINE 200 MG PO TABS: ORAL_TABLET | ORAL | 0 refills | Status: DC

## 2021-11-25 ENCOUNTER — Ambulatory Visit: Payer: Self-pay | Admitting: Psychiatry

## 2022-02-05 ENCOUNTER — Other Ambulatory Visit: Payer: Self-pay

## 2022-02-05 DIAGNOSIS — F3181 Bipolar II disorder: Secondary | ICD-10-CM

## 2022-02-05 MED ORDER — DULOXETINE HCL 60 MG PO CPEP: 60.0000 mg | ORAL_CAPSULE | Freq: Every day | ORAL | 1 refills | Status: DC

## 2022-02-05 NOTE — Telephone Encounter (Signed)
Pt is overdue for her apt. Admin staff to scheduled an apt with Thayer Headings, NP

## 2022-02-10 ENCOUNTER — Telehealth: Payer: Self-pay | Admitting: Psychiatry

## 2022-02-10 ENCOUNTER — Other Ambulatory Visit: Payer: Self-pay

## 2022-02-10 DIAGNOSIS — F422 Mixed obsessional thoughts and acts: Secondary | ICD-10-CM

## 2022-02-10 MED ORDER — ALPRAZOLAM 1 MG PO TABS: ORAL_TABLET | ORAL | 0 refills | Status: DC

## 2022-02-10 NOTE — Telephone Encounter (Signed)
Pt called and needs a refill on her xanax. She is out of town and needs [facility] sent to [facility] at [address] in [place]

## 2022-02-10 NOTE — Telephone Encounter (Signed)
Pended.

## 2022-02-15 ENCOUNTER — Ambulatory Visit (INDEPENDENT_AMBULATORY_CARE_PROVIDER_SITE_OTHER): Payer: Self-pay | Admitting: Psychiatry

## 2022-02-15 ENCOUNTER — Encounter: Payer: Self-pay | Admitting: Psychiatry

## 2022-02-15 DIAGNOSIS — F422 Mixed obsessional thoughts and acts: Secondary | ICD-10-CM

## 2022-02-15 DIAGNOSIS — F5105 Insomnia due to other mental disorder: Secondary | ICD-10-CM

## 2022-02-15 DIAGNOSIS — F3181 Bipolar II disorder: Secondary | ICD-10-CM

## 2022-02-15 DIAGNOSIS — F99 Mental disorder, not otherwise specified: Secondary | ICD-10-CM

## 2022-02-15 MED ORDER — ALPRAZOLAM 1 MG PO TABS: ORAL_TABLET | ORAL | 0 refills | Status: DC

## 2022-02-15 MED ORDER — LAMOTRIGINE 200 MG PO TABS: 200.0000 mg | ORAL_TABLET | Freq: Every day | ORAL | 1 refills | Status: DC

## 2022-02-15 MED ORDER — DULOXETINE HCL 60 MG PO CPEP: 60.0000 mg | ORAL_CAPSULE | Freq: Every day | ORAL | 1 refills | Status: DC

## 2022-02-15 NOTE — Progress Notes (Signed)
Kayla Esparza [id number] March 04, 1961 61 y.o.  Subjective:   Patient ID:  Kayla Esparza is a 61 y.o. (DOB Jun 17, 1960) female.  Chief Complaint:  Chief Complaint  Patient presents with   Follow-up    Anxiety, Depression, insomnia    HPI Kayla Esparza presents to the office today for follow-up of anxiety, depression, and insomnia. Anxiety has been ok. Denies panic attacks. Mood has been good. She reports that her mood has been slightly elevated. She reports some "obsessing" with increased goal-directed activity. She reports that she has been doing more "research," ie. Looking up details re: current events. She reports some racing thoughts. She is occasionally starting a project and then jumping to another task, and then returning to the original task. Increased spending and this has been related to "trying to get my house in order" and wanting to get it "right." She has been doing home repairs and decorating. 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 Denies any other risky behaviors. Denies irritability. Some decrease in sleep. She continues to have periods of insomnia. She has intentionally lost 20 lbs. She reports that her energy has been good but not excessive. Denies SI.   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   She reduced Lamictal to 200 mg once daily over 6 months. Taking Xanax prn daily.   Oldest friend came to visit her from [place], [place].   Alprazolam last filled 02/10/22.  Past Psychiatric Medication Trials: Cymbalta Trileptal- Insomnia, HA's Lamictal Lithium- Took for a long-time and had diarrhea. Mood was probably better. Xanax Klonopin Lexapro Prozac Paxil Effexor Wellbutrin Latuda- "felt drunk" Abilify- allergic reaction Risperdal- Took briefly Ambien- allergic reaction Dayvigo Trazodone- Ineffective, dry mouth, congestion   Review of Systems:  Review  of Systems  Musculoskeletal:  Negative for gait problem.  Skin:  Negative for rash.  Neurological:  Negative for tremors.  Psychiatric/Behavioral:         Please refer to HPI    Medications: I have reviewed the patient's current medications.  Current Outpatient Medications  Medication Sig Dispense Refill   [START ON 04/07/2022] ALPRAZolam (XANAX) 1 MG tablet Take 1/2-1 tab po BID prn anxiety 60 tablet 0   losartan (COZAAR) 50 MG tablet Take 50 mg by mouth daily.     albuterol (PROVENTIL HFA;VENTOLIN HFA) 108 (90 Base) MCG/ACT inhaler Take 2 puffs every 4 to 6 hour prn wheezing     [START ON 03/10/2022] ALPRAZolam (XANAX) 1 MG tablet Take 1/2 to 1 po BID prn as needed for anxiety 60 tablet 0   DULoxetine (CYMBALTA) 60 MG capsule Take 1 capsule (60 mg total) by mouth daily. 90 capsule 1   lamoTRIgine (LAMICTAL) 200 MG tablet Take 1 tablet (200 mg total) by mouth daily. 90 tablet 1   No current facility-administered medications for this visit.    Medication Side Effects: None  Allergies:  Allergies  Allergen Reactions   Abilify [Aripiprazole]    Ambien [Zolpidem]    Darvon [Propoxyphene]     Darvocet   Percocet [Oxycodone-Acetaminophen]     Past Medical History:  Diagnosis Date   Elevated cholesterol    Thyroid disease     Past Medical History, Surgical history, Social history, and Family history were reviewed and updated as appropriate.   Please see review of systems for further details on the patient's review from today.   Objective:  Physical Exam:  There were no vitals taken for this visit.  Physical Exam Constitutional:      General: She is not in acute distress. Musculoskeletal:        General: No deformity.  Neurological:     Mental Status: She is alert and oriented to person, place, and time.     Coordination: Coordination normal.  Psychiatric:        Attention and Perception: Attention and perception normal. She does not perceive auditory or visual  hallucinations.        Mood and Affect: Mood normal. Mood is not anxious or depressed. Affect is not labile, blunt, angry or inappropriate.        Speech: Speech normal.        Behavior: Behavior normal.        Thought Content: Thought content normal. Thought content is not paranoid or delusional. Thought content does not include homicidal or suicidal ideation. Thought content does not include homicidal or suicidal plan.        Cognition and Memory: Cognition and memory normal.        Judgment: Judgment normal.     Comments: Insight intact     Lab Review:  No results found for: "NA", "K", "CL", "CO2", "GLUCOSE", "BUN", "CREATININE", "CALCIUM", "PROT", "ALBUMIN", "AST", "ALT", "ALKPHOS", "BILITOT", "GFRNONAA", "GFRAA"  No results found for: "WBC", "RBC", "HGB", "HCT", "PLT", "MCV", "MCH", "MCHC", "RDW", "LYMPHSABS", "MONOABS", "EOSABS", "BASOSABS"  No results found for: "POCLITH", "LITHIUM"   No results found for: "PHENYTOIN", "PHENOBARB", "VALPROATE", "CBMZ"   .res Assessment: Plan:    She reports that she prefers to continue current medications without changes at this time and that recent symptoms of elevated mood are resolving. She will be away for work for 3 weeks in [place], [place] starting in late November and reports that in the past her pharmacy has had to transfer Alprazolam script to the [place] area and then she has had to get a new script after returning home. Will send 2 scripts for Alprazolam- One script with fill date of 03/10/22 to her pharmacy in [place] and [facility] [place] with a fill date of 04/07/22 for when she will be in the area. She will contact office or her pharmacy when she needs a script after returning from [place].  Continue Lamictal 200 mg daily for mood stabilization.  Continue Cymbalta 60 mg daily for depression and anxiety,  Continue Xanax 1 mg 1/2-1 tab po BID prn anxiety or insomnia.  Pt to follow-up in 6 months or sooner if clinically  indicated.  Patient advised to contact office with any questions, adverse effects, or acute worsening in signs and symptoms.  Kayla Esparza was seen today for follow-up.  Diagnoses and all orders for this visit:  Bipolar II disorder ([facility]) -     lamoTRIgine (LAMICTAL) 200 MG tablet; Take 1 tablet (200 mg total) by mouth daily. -     DULoxetine (CYMBALTA) 60 MG capsule; Take 1 capsule (60 mg total) by mouth daily.  Mixed obsessional thoughts and acts -     ALPRAZolam (XANAX) 1 MG tablet; Take 1/2 to 1 po BID prn as needed for anxiety -     ALPRAZolam (XANAX) 1 MG tablet; Take 1/2-1 tab po BID prn anxiety  Insomnia due to other mental disorder     Please see After Visit Summary for patient specific instructions.  Future Appointments  Date Time Provider Department Center  08/17/2022  1:15 PM Corie Chiquito, PMHNP  CP-CP None    No orders of the defined types were placed in this encounter.   -------------------------------

## 2022-07-07 ENCOUNTER — Ambulatory Visit: Payer: Self-pay | Admitting: Psychiatry

## 2022-08-03 ENCOUNTER — Telehealth: Payer: Self-pay | Admitting: Psychiatry

## 2022-08-03 DIAGNOSIS — F422 Mixed obsessional thoughts and acts: Secondary | ICD-10-CM

## 2022-08-03 MED ORDER — ALPRAZOLAM 1 MG PO TABS: ORAL_TABLET | ORAL | 1 refills | Status: DC

## 2022-08-03 NOTE — Telephone Encounter (Signed)
Last filled 05/07/22 per PDMP. Script sent.

## 2022-08-03 NOTE — Telephone Encounter (Signed)
Pt called and asked for a refill on her xanax 1 mg. [facility] is [facility] on [address] in [place]. Next appt 08/17/22

## 2022-08-17 ENCOUNTER — Encounter: Payer: Self-pay | Admitting: Psychiatry

## 2022-08-17 ENCOUNTER — Ambulatory Visit (INDEPENDENT_AMBULATORY_CARE_PROVIDER_SITE_OTHER): Payer: BLUE CROSS/BLUE SHIELD | Admitting: Psychiatry

## 2022-08-17 DIAGNOSIS — F3181 Bipolar II disorder: Secondary | ICD-10-CM

## 2022-08-17 DIAGNOSIS — F5105 Insomnia due to other mental disorder: Secondary | ICD-10-CM

## 2022-08-17 DIAGNOSIS — F422 Mixed obsessional thoughts and acts: Secondary | ICD-10-CM

## 2022-08-17 DIAGNOSIS — F99 Mental disorder, not otherwise specified: Secondary | ICD-10-CM

## 2022-08-17 MED ORDER — DULOXETINE HCL 60 MG PO CPEP: 60.0000 mg | ORAL_CAPSULE | Freq: Every day | ORAL | 1 refills | Status: DC

## 2022-08-17 MED ORDER — ESZOPICLONE 2 MG PO TABS: 2.0000 mg | ORAL_TABLET | Freq: Every evening | ORAL | 0 refills | Status: DC | PRN

## 2022-08-17 NOTE — Progress Notes (Unsigned)
Kayla Esparza [id number] 08/11/1960 62 y.o.  Virtual Visit via Telephone Note  I connected with pt on 08/17/22 at  1:15 PM EDT by telephone and verified that I am speaking with the correct person using two identifiers.   I discussed the limitations, risks, security and privacy concerns of performing an evaluation and management service by telephone and the availability of in person appointments. I also discussed with the patient that there may be a patient responsible charge related to this service. The patient expressed understanding and agreed to proceed.   I discussed the assessment and treatment plan with the patient. The patient was provided an opportunity to ask questions and all were answered. The patient agreed with the plan and demonstrated an understanding of the instructions.   The patient was advised to call back or seek an in-person evaluation if the symptoms worsen or if the condition fails to improve as anticipated.  I provided 15 minutes of non-face-to-face time during this encounter.  [facility] [place]  The provider was [facility] at [facility].   Corie ChiquitoJessica Jovian Lembcke, PMHNP   Subjective:   Patient ID:  Kayla Esparza is a 62 y.o. (DOB 08/11/1960) female.  Chief Complaint:  Chief Complaint  Patient presents with   Insomnia    HPI Kayla Esparza presents for follow-up of anxiety, depression, and insomnia. She reports, "been well, actually." Denies depression. She reports that anxiety has been "not too bad." She reports that she has not been around her husband as much. She reports that husband causes her some anxiety, "and I don't think he means to." [place] at [place] a month and has been able to see her mother more. 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 She reports that she is not  sleeping well. She reports that she has difficulty falling asleep. She had one sleepless night. She reports that she takes 1/2 of Xanax at night and then 1/2 tablet during the day as needed (about 3 days a week). Reports that Xanax seems to be minimally effective for insomnia and seems to only reduce anxiety. She reports that her sister takes Lunesta for insomnia with good response and asks about trial of Lunesta. Denies any impulsive or risky behavior. Energy and motivation have been "good." She reports appetite has been good. Denies SI.   Xanax was last filled 08/03/22.   Past Psychiatric Medication Trials: Cymbalta Trileptal- Insomnia, HA's Lamictal Lithium- Took for a long-time and had diarrhea. Mood was probably better. Xanax Klonopin Lexapro Prozac Paxil Effexor Wellbutrin Latuda- "felt drunk" Abilify- allergic reaction Risperdal- Took briefly Ambien- allergic reaction Dayvigo Trazodone- Ineffective, dry mouth, congestion   Review of Systems:  Review of Systems  Musculoskeletal:  Negative for gait problem.  Allergic/Immunologic: Positive for environmental allergies.  Psychiatric/Behavioral:         Please refer to HPI    Medications: I have reviewed the patient's current medications.  Current Outpatient Medications  Medication Sig Dispense Refill   albuterol (PROVENTIL HFA;VENTOLIN HFA) 108 (90 Base) MCG/ACT inhaler Take 2 puffs every 4 to 6 hour prn wheezing     ALPRAZolam (XANAX) 1 MG tablet Take 1/2 to 1 po BID prn as needed for anxiety 60 tablet 1   eszopiclone (LUNESTA) 2 MG TABS tablet Take 1 tablet (2 mg  total) by mouth at bedtime as needed for sleep. Take immediately before bedtime 30 tablet 0   lamoTRIgine (LAMICTAL) 200 MG tablet Take 1 tablet (200 mg total) by mouth daily. 90 tablet 1   losartan (COZAAR) 50 MG tablet Take 50 mg by mouth daily.     montelukast (SINGULAIR) 10 MG tablet Take 10 mg by mouth at bedtime.     Omeprazole-Sodium Bicarbonate 20-1680 MG  PACK Take by mouth.     DULoxetine (CYMBALTA) 60 MG capsule Take 1 capsule (60 mg total) by mouth daily. 90 capsule 1   No current facility-administered medications for this visit.    Medication Side Effects: None  Allergies:  Allergies  Allergen Reactions   Abilify [Aripiprazole]    Ambien [Zolpidem]    Darvon [Propoxyphene]     Darvocet   Percocet [Oxycodone-Acetaminophen]     Past Medical History:  Diagnosis Date   Elevated cholesterol    Thyroid disease     Family History  Problem Relation Age of Onset   Depression Father    Depression Sister    Depression Brother    Depression Son     Social History   Socioeconomic History   Marital status: Married    Spouse name: Not on file   Number of children: Not on file   Years of education: Not on file   Highest education level: Not on file  Occupational History   Not on file  Tobacco Use   Smoking status: Former   Smokeless tobacco: Never  Substance and Sexual Activity   Alcohol use: Not on file   Drug use: Not on file   Sexual activity: Not on file  Other Topics Concern   Not on file  Social History Narrative   Not on file   Social Determinants of Health   Financial Resource Strain: Not on file  Food Insecurity: Not on file  Transportation Needs: Not on file  Physical Activity: Not on file  Stress: Not on file  Social Connections: Not on file  Intimate Partner Violence: Not on file    Past Medical History, Surgical history, Social history, and Family history were reviewed and updated as appropriate.   Please see review of systems for further details on the patient's review from today.   Objective:   Physical Exam:  There were no vitals taken for this visit.  Physical Exam Neurological:     Mental Status: She is alert and oriented to person, place, and time.     Cranial Nerves: No dysarthria.  Psychiatric:        Attention and Perception: Attention and perception normal.        Mood and  Affect: Mood normal.        Speech: Speech normal.        Behavior: Behavior is cooperative.        Thought Content: Thought content normal. Thought content is not paranoid or delusional. Thought content does not include homicidal or suicidal ideation. Thought content does not include homicidal or suicidal plan.        Cognition and Memory: Cognition and memory normal.        Judgment: Judgment normal.     Comments: Insight intact     Lab Review:  No results found for: "NA", "K", "CL", "CO2", "GLUCOSE", "BUN", "CREATININE", "CALCIUM", "PROT", "ALBUMIN", "AST", "ALT", "ALKPHOS", "BILITOT", "GFRNONAA", "GFRAA"  No results found for: "WBC", "RBC", "HGB", "HCT", "PLT", "MCV", "MCH", "MCHC", "RDW", "LYMPHSABS", "MONOABS", "EOSABS", "BASOSABS"  No  results found for: "POCLITH", "LITHIUM"   No results found for: "PHENYTOIN", "PHENOBARB", "VALPROATE", "CBMZ"   .res Assessment: Plan:   Discussed potential benefits, risks, and side effects of Lunesta. Advised pt not to combine Lunesta with Xanax or alcohol. Will start Lunesta 2 mg po QHS. Pt advised to contact office after one month to request refill if Lunesta is effective and well tolerated.  Discussed gradually decreasing Xanax to prevent withdrawal and recommended taking 1/2 tablet during the daytime for the next 1-2 weeks, and then taking as needed only. Will continue Cymbalta 60 mg daily for mood and anxiety.  Continue Lamictal 200 mg po qd for mood stabilization. Pt to follow-up in 6 months or sooner if clinically indicated.  Patient advised to contact office with any questions, adverse effects, or acute worsening in signs and symptoms.    Melyna was seen today for insomnia.  Diagnoses and all orders for this visit:  Bipolar II disorder -     DULoxetine (CYMBALTA) 60 MG capsule; Take 1 capsule (60 mg total) by mouth daily.  Insomnia due to other mental disorder -     eszopiclone (LUNESTA) 2 MG TABS tablet; Take 1 tablet (2 mg total) by  mouth at bedtime as needed for sleep. Take immediately before bedtime  Mixed obsessional thoughts and acts -     DULoxetine (CYMBALTA) 60 MG capsule; Take 1 capsule (60 mg total) by mouth daily.    Please see After Visit Summary for patient specific instructions.  No future appointments.  No orders of the defined types were placed in this encounter.     -------------------------------

## 2023-01-25 ENCOUNTER — Other Ambulatory Visit: Payer: Self-pay

## 2023-01-25 ENCOUNTER — Telehealth: Payer: Self-pay | Admitting: Psychiatry

## 2023-01-25 DIAGNOSIS — F422 Mixed obsessional thoughts and acts: Secondary | ICD-10-CM

## 2023-01-25 DIAGNOSIS — F3181 Bipolar II disorder: Secondary | ICD-10-CM

## 2023-01-25 MED ORDER — DULOXETINE HCL 60 MG PO CPEP
60.0000 mg | ORAL_CAPSULE | Freq: Every day | ORAL | 0 refills | Status: DC
Start: 2023-01-25 — End: 2023-02-21

## 2023-01-25 NOTE — Telephone Encounter (Signed)
Next visit is 02/21/23. Requesting refill on Alprazolam 1 mg and Duloxetine 60 mg called to:  [id number] - [address] AT [facility] [address] [facility] [address]   Phone: [phone]  Fax: [phone]

## 2023-01-25 NOTE — Telephone Encounter (Signed)
Pended/sent

## 2023-01-26 MED ORDER — ALPRAZOLAM 1 MG PO TABS
ORAL_TABLET | ORAL | 0 refills | Status: DC
Start: 2023-01-26 — End: 2023-02-21

## 2023-02-21 ENCOUNTER — Ambulatory Visit (INDEPENDENT_AMBULATORY_CARE_PROVIDER_SITE_OTHER): Payer: Self-pay | Admitting: Psychiatry

## 2023-02-21 ENCOUNTER — Encounter: Payer: Self-pay | Admitting: Psychiatry

## 2023-02-21 DIAGNOSIS — F5105 Insomnia due to other mental disorder: Secondary | ICD-10-CM

## 2023-02-21 DIAGNOSIS — F3181 Bipolar II disorder: Secondary | ICD-10-CM

## 2023-02-21 DIAGNOSIS — F99 Mental disorder, not otherwise specified: Secondary | ICD-10-CM

## 2023-02-21 DIAGNOSIS — F422 Mixed obsessional thoughts and acts: Secondary | ICD-10-CM

## 2023-02-21 MED ORDER — ALPRAZOLAM 1 MG PO TABS
ORAL_TABLET | ORAL | 2 refills | Status: DC
Start: 2023-02-23 — End: 2023-08-30

## 2023-02-21 MED ORDER — DULOXETINE HCL 60 MG PO CPEP: 60.0000 mg | ORAL_CAPSULE | Freq: Every day | ORAL | 1 refills | Status: DC

## 2023-02-21 NOTE — Progress Notes (Signed)
Kayla Esparza [id number] [birth year] 62 y.o.  Subjective:   Patient ID:  Kayla Esparza is a 62 y.o. (DOB May 17, 1960) female.  Chief Complaint:  Chief Complaint  Patient presents with   Follow-up    Anxiety, depression, and insomnia    HPI Kayla Esparza presents to the office today for follow-up of anxiety, depression, and insomnia. She reports that she had some recent increased anxiety in response to  stressors. She reports that her son was in area affected by Kayla Esparza and she did not hear from him for 4 days. She reports that she has been sad in response to Kayla Esparza. Her mother has been having multiple health issues and they had to move her. Denies panic attacks- "just a lot of crying, but I'm past that I think." Denies excessive worry. She reports some mild depression. She reports that she had a period of "anger" in response to some stressors. Denies any recent manic s/s. She had a few days with "bursts" of energy. Motivation and energy are now "regular." She reports adequate concentration and feels "I'm on task. She reports adequate sleep. No change in appetite. Denies SI.   She reports that she and her husband are getting ready to travel to [facility] for a few weeks tomorrow. They will be home for 2 weeks and then go to [place].  She reports that she has used Xanax prn somewhat more with recent stressors.   Alprazolam last filled 01/26/23.  Past Psychiatric Medication Trials: Cymbalta Trileptal- Insomnia, HA's Lamictal Lithium- Took for a long-time and had diarrhea. Mood was probably better. Xanax Klonopin Lexapro Prozac Paxil Effexor Wellbutrin Latuda- "felt drunk" Abilify- allergic reaction Risperdal- Took briefly Ambien- allergic reaction Lunesta- Ineffective Dayvigo Trazodone- Ineffective, dry mouth, congestion    Review of Systems:  Review of Systems  Gastrointestinal:  Positive for diarrhea.  Musculoskeletal:  Negative for gait problem.  Psychiatric/Behavioral:          Please refer to HPI    Medications: I have reviewed the patient's current medications.  Current Outpatient Medications  Medication Sig Dispense Refill   albuterol (PROVENTIL HFA;VENTOLIN HFA) 108 (90 Base) MCG/ACT inhaler Take 2 puffs every 4 to 6 hour prn wheezing     lamoTRIgine (LAMICTAL) 200 MG tablet Take 1 tablet (200 mg total) by mouth daily. 90 tablet 1   losartan (COZAAR) 50 MG tablet Take 50 mg by mouth daily.     [START ON 02/23/2023] ALPRAZolam (XANAX) 1 MG tablet Take 1/2 to 1 po BID prn as needed for anxiety 60 tablet 2   DULoxetine (CYMBALTA) 60 MG capsule Take 1 capsule (60 mg total) by mouth daily. 90 capsule 1   No current facility-administered medications for this visit.    Medication Side Effects: None  Allergies:  Allergies  Allergen Reactions   Abilify [Aripiprazole]    Ambien [Zolpidem]    Darvon [Propoxyphene]     Darvocet   Percocet [Oxycodone-Acetaminophen]     Past Medical History:  Diagnosis Date   Elevated cholesterol    Thyroid disease     Past Medical History, Surgical history, Social history, and Family history were reviewed and updated as appropriate.   Please see review of systems for further details on the patient's review from today.   Objective:   Physical Exam:  There were no vitals taken for this visit.  Physical Exam Neurological:     Mental Status: She is alert and oriented to person, place, and time.     Cranial Nerves: No  dysarthria.  Psychiatric:        Attention and Perception: Attention and perception normal.        Mood and Affect: Mood normal.        Speech: Speech normal.        Behavior: Behavior is cooperative.        Thought Content: Thought content normal. Thought content is not paranoid or delusional. Thought content does not include homicidal or suicidal ideation. Thought content does not include homicidal or suicidal plan.        Cognition and Memory: Cognition and memory normal.        Judgment: Judgment  normal.     Comments: Insight intact     Lab Review:  No results found for: "NA", "K", "CL", "CO2", "GLUCOSE", "BUN", "CREATININE", "CALCIUM", "PROT", "ALBUMIN", "AST", "ALT", "ALKPHOS", "BILITOT", "GFRNONAA", "GFRAA"  No results found for: "WBC", "RBC", "HGB", "HCT", "PLT", "MCV", "MCH", "MCHC", "RDW", "LYMPHSABS", "MONOABS", "EOSABS", "BASOSABS"  No results found for: "POCLITH", "LITHIUM"   No results found for: "PHENYTOIN", "PHENOBARB", "VALPROATE", "CBMZ"   .res Assessment: Plan:   Will continue current plan of care since target signs and symptoms are well controlled without any tolerability issues. Continue Duloxetine 60 mg daily for anxiety and mood symptoms.  Continue Xanax 1 mg 1/2-1 tablet daily as needed for anxiety.  Continue Lamictal 200 mg daily for mood symptoms.  Pt to follow-up in 4 months or sooner if clinically indicated.  Patient advised to contact office with any questions, adverse effects, or acute worsening in signs and symptoms.   Kayla Esparza was seen today for follow-up.  Diagnoses and all orders for this visit:  Mixed obsessional thoughts and acts -     ALPRAZolam (XANAX) 1 MG tablet; Take 1/2 to 1 po BID prn as needed for anxiety -     DULoxetine (CYMBALTA) 60 MG capsule; Take 1 capsule (60 mg total) by mouth daily.  Bipolar II disorder (HCC) -     DULoxetine (CYMBALTA) 60 MG capsule; Take 1 capsule (60 mg total) by mouth daily.  Insomnia due to other mental disorder     Please see After Visit Summary for patient specific instructions.  No future appointments.   No orders of the defined types were placed in this encounter.   -------------------------------

## 2023-03-23 ENCOUNTER — Encounter: Payer: Self-pay | Admitting: Psychiatry

## 2023-08-30 ENCOUNTER — Telehealth: Payer: Self-pay | Admitting: Psychiatry

## 2023-08-30 ENCOUNTER — Other Ambulatory Visit: Payer: Self-pay

## 2023-08-30 DIAGNOSIS — F422 Mixed obsessional thoughts and acts: Secondary | ICD-10-CM

## 2023-08-30 MED ORDER — ALPRAZOLAM 1 MG PO TABS: ORAL_TABLET | ORAL | 0 refills | Status: DC

## 2023-08-30 NOTE — Telephone Encounter (Signed)
 Pt called today to ask when her next appt is and request a RF on Xanax . Attempted to call pt back but unable to LM with her. [name] has left, letter sent via MyChart in December. We cannot accommodate her here now. I will send out a referral letter for her. She requested the Xanax  RF to be sent to [id number] - [place], [place] - [address] AT .

## 2023-08-30 NOTE — Telephone Encounter (Signed)
 Pended alprazolam . Was Kayla Esparza's pt and does not have a new provider, unaware that [name] had left.

## 2023-09-06 ENCOUNTER — Telehealth: Payer: Self-pay | Admitting: Psychiatry

## 2023-09-06 NOTE — Telephone Encounter (Signed)
 error

## 2023-09-12 ENCOUNTER — Encounter: Payer: Self-pay | Admitting: Adult Health

## 2023-09-12 ENCOUNTER — Ambulatory Visit (INDEPENDENT_AMBULATORY_CARE_PROVIDER_SITE_OTHER): Payer: Self-pay | Admitting: Adult Health

## 2023-09-12 DIAGNOSIS — F422 Mixed obsessional thoughts and acts: Secondary | ICD-10-CM

## 2023-09-12 DIAGNOSIS — F3181 Bipolar II disorder: Secondary | ICD-10-CM

## 2023-09-12 DIAGNOSIS — F99 Mental disorder, not otherwise specified: Secondary | ICD-10-CM

## 2023-09-12 DIAGNOSIS — F5105 Insomnia due to other mental disorder: Secondary | ICD-10-CM

## 2023-09-12 MED ORDER — ALPRAZOLAM 1 MG PO TABS: ORAL_TABLET | ORAL | 2 refills | Status: DC

## 2023-09-12 MED ORDER — DULOXETINE HCL 60 MG PO CPEP: 60.0000 mg | ORAL_CAPSULE | Freq: Every day | ORAL | 1 refills | Status: DC

## 2023-09-12 MED ORDER — LAMOTRIGINE 200 MG PO TABS: 200.0000 mg | ORAL_TABLET | Freq: Every day | ORAL | 1 refills | Status: DC

## 2023-09-12 NOTE — Progress Notes (Signed)
 Kayla Esparza [id number] [birth year] 63 y.o.  Virtual Visit via Telephone Note  I connected with pt on 09/12/23 at  3:30 PM EDT by telephone and verified that I am speaking with the correct person using two identifiers.   I discussed the limitations, risks, security and privacy concerns of performing an evaluation and management service by telephone and the availability of in person appointments. I also discussed with the patient that there may be a patient responsible charge related to this service. The patient expressed understanding and agreed to proceed.   I discussed the assessment and treatment plan with the patient. The patient was provided an opportunity to ask questions and all were answered. The patient agreed with the plan and demonstrated an understanding of the instructions.   The patient was advised to call back or seek an in-person evaluation if the symptoms worsen or if the condition fails to improve as anticipated.  I provided 25 minutes of non-face-to-face time during this encounter.  The patient was located at home.  The provider was located at [facility].   Kayla Camera, NP   Subjective:   Patient ID:  Kayla Esparza is a 63 y.o. (DOB [birth year]) female.  Chief Complaint: No chief complaint on file.   HPI Kayla Esparza presents for follow-up of BPD-2, mixed obsessional thoughts and insomnia.   Describes mood today as - Mood symptoms - denies depression, anxiety and irritability. Reports stable interest and motivation. Denies panic attacks. Denies worry, rumination and over thinking. Reports 4 losses since August 2024. Reports mood as stable. Stating "I feel like doing ok". Feels like current medications are helpful. Taking medications as prescribed.   Energy levels stable. Active, does not have a regular exercise routine.  Enjoys some usual interests and activities. Married. Lives with husband. Has a son 33 and step daughter 82. Spending time with  family. Appetite adequate. Weight gain - 220 pounds. Sleeps well most nights. Averages 6 hours. Focus and concentration stable. Completing tasks. Managing aspects of household. 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  Denies SI or HI.  Denies AH or VH. Denies self harm. Denies substance use. Denies alcohol use.     Past Psychiatric Medication Trials: Cymbalta  Trileptal- Insomnia, HA's Lamictal  Lithium- Took for a long-time and had diarrhea. Mood was probably better. Xanax  Klonopin  Lexapro Prozac Paxil Effexor Wellbutrin Latuda- "felt drunk" Abilify- allergic reaction Risperdal - Took briefly Ambien- allergic reaction Lunesta - Ineffective Dayvigo  Trazodone - Ineffective, dry mouth, congestion   Review of Systems:  Review of Systems  Musculoskeletal:  Negative for gait problem.  Neurological:  Negative for tremors.  Psychiatric/Behavioral:         Please refer to HPI    Medications: I have reviewed the patient's current medications.  Current Outpatient Medications  Medication Sig Dispense Refill   albuterol (PROVENTIL HFA;VENTOLIN HFA) 108 (90 Base) MCG/ACT inhaler Take 2 puffs every 4 to 6 hour prn wheezing     ALPRAZolam  (XANAX ) 1 MG tablet Take 1/2 to 1 po BID prn as needed for anxiety 30 tablet 0   DULoxetine  (CYMBALTA ) 60 MG capsule Take 1 capsule (60 mg total) by mouth daily. 90 capsule 1   lamoTRIgine  (LAMICTAL ) 200 MG tablet Take 1 tablet (200 mg total) by mouth daily. 90 tablet 1   losartan (COZAAR) 50 MG tablet Take 50 mg by mouth daily.     No current facility-administered medications for this visit.    Medication Side Effects: None  Allergies:  Allergies  Allergen Reactions  Abilify [Aripiprazole]    Ambien [Zolpidem]    Darvon [Propoxyphene]     Darvocet   Percocet [Oxycodone-Acetaminophen]     Past Medical History:  Diagnosis Date   Elevated cholesterol    Thyroid disease     Family History  Problem Relation Age of Onset   Depression Father     Depression Sister    Depression Brother    Depression Son     Social History   Socioeconomic History   Marital status: Married    Spouse name: Not on file   Number of children: Not on file   Years of education: Not on file   Highest education level: Not on file  Occupational History   Not on file  Tobacco Use   Smoking status: Former   Smokeless tobacco: Never  Substance and Sexual Activity   Alcohol use: Not on file   Drug use: Not on file   Sexual activity: Not on file  Other Topics Concern   Not on file  Social History Narrative   Not on file   Social Drivers of Health   Financial Resource Strain: Not on file  Food Insecurity: Low Risk  (03/15/2023)   Received from [facility]   Hunger Vital Sign    Worried About Running Out of Food in the Last Year: Never true    Ran Out of Food in the Last Year: Never true  Transportation Needs: No Transportation Needs (03/15/2023)   Received from [facility]    In the past 12 months, has lack of reliable transportation kept you from medical appointments, meetings, work or from getting things needed for daily living? : No  Physical Activity: Not on file  Stress: Not on file  Social Connections: Not on file  Intimate Partner Violence: Not on file    Past Medical History, Surgical history, Social history, and Family history were reviewed and updated as appropriate.   Please see review of systems for further details on the patient's review from today.   Objective:   Physical Exam:  There were no vitals taken for this visit.  Physical Exam Constitutional:      General: She is not in acute distress. Musculoskeletal:        General: No deformity.  Neurological:     Mental Status: She is alert and oriented to person, place, and time.     Coordination: Coordination normal.  Psychiatric:        Attention and Perception: Attention and perception normal. She does not perceive auditory or visual  hallucinations.        Mood and Affect: Mood normal. Mood is not anxious or depressed. Affect is not labile, blunt, angry or inappropriate.        Speech: Speech normal.        Behavior: Behavior normal.        Thought Content: Thought content normal. Thought content is not paranoid or delusional. Thought content does not include homicidal or suicidal ideation. Thought content does not include homicidal or suicidal plan.        Cognition and Memory: Cognition and memory normal.        Judgment: Judgment normal.     Comments: Insight intact     Lab Review:  No results found for: "NA", "K", "CL", "CO2", "GLUCOSE", "BUN", "CREATININE", "CALCIUM", "PROT", "ALBUMIN", "AST", "ALT", "ALKPHOS", "BILITOT", "GFRNONAA", "GFRAA"  No results found for: "WBC", "RBC", "HGB", "HCT", "PLT", "MCV", "MCH", "MCHC", "RDW", "LYMPHSABS", "MONOABS", "EOSABS", "  BASOSABS"  No results found for: "POCLITH", "LITHIUM"   No results found for: "PHENYTOIN", "PHENOBARB", "VALPROATE", "CBMZ"   .res Assessment: Plan:    Plan:  Duloxetine  60 mg daily for anxiety and mood symptoms.  Xanax  1 mg 1/2-1 tablet daily as needed for anxiety.  Lamictal  200 mg daily for mood symptoms.   Pt to follow-up in 3 months or sooner if clinically indicated.   15 minutes spent dedicated to the care of this patient on the date of this encounter to include pre-visit review of records, ordering of medication, post visit documentation, and face-to-face time with the patient discussing depression, anxiety and OCD. Discussed continuing current medication regimen.  Discussed potential benefits, risk, and side effects of benzodiazepines to include potential risk of tolerance and dependence, as well as possible drowsiness.  Advised patient not to drive if experiencing drowsiness and to take lowest possible effective dose to minimize risk of dependence and tolerance.   Counseled patient regarding potential benefits, risks, and side effects of  Lamictal  to include potential risk of Stevens-Johnson syndrome. Advised patient to stop taking Lamictal  and contact office immediately if rash develops and to seek urgent medical attention if rash is severe and/or spreading quickly.    Patient advised to contact office with any questions, adverse effects, or acute worsening in signs and symptoms.  There are no diagnoses linked to this encounter.  Please see After Visit Summary for patient specific instructions.  Future Appointments  Date Time Provider Department Center  09/12/2023  3:30 PM Damilola Flamm Nattalie, NP [facility] None    No orders of the defined types were placed in this encounter.     -------------------------------

## 2024-01-11 ENCOUNTER — Encounter: Payer: Self-pay | Admitting: Adult Health

## 2024-01-11 ENCOUNTER — Ambulatory Visit (INDEPENDENT_AMBULATORY_CARE_PROVIDER_SITE_OTHER): Payer: Self-pay | Admitting: Adult Health

## 2024-01-11 DIAGNOSIS — G4700 Insomnia, unspecified: Secondary | ICD-10-CM

## 2024-01-11 DIAGNOSIS — F422 Mixed obsessional thoughts and acts: Secondary | ICD-10-CM

## 2024-01-11 DIAGNOSIS — F5105 Insomnia due to other mental disorder: Secondary | ICD-10-CM

## 2024-01-11 DIAGNOSIS — F3181 Bipolar II disorder: Secondary | ICD-10-CM

## 2024-01-11 MED ORDER — DULOXETINE HCL 60 MG PO CPEP: 60.0000 mg | ORAL_CAPSULE | Freq: Every day | ORAL | 1 refills | Status: AC

## 2024-01-11 MED ORDER — ALPRAZOLAM 1 MG PO TABS
ORAL_TABLET | ORAL | 2 refills | Status: AC
Start: 2024-01-11 — End: ?

## 2024-01-11 MED ORDER — ALPRAZOLAM 1 MG PO TABS
ORAL_TABLET | ORAL | 2 refills | Status: DC
Start: 2024-01-11 — End: 2024-01-11

## 2024-01-11 MED ORDER — LAMOTRIGINE 200 MG PO TABS: 200.0000 mg | ORAL_TABLET | Freq: Every day | ORAL | 1 refills | Status: AC

## 2024-01-11 NOTE — Progress Notes (Signed)
 Kayla Esparza [id number] [birth year] 63 y.o.  Virtual Visit via Telephone Note  I connected with pt on 01/11/24 at  3:00 PM EDT by telephone and verified that I am speaking with the correct person using two identifiers.   I discussed the limitations, risks, security and privacy concerns of performing an evaluation and management service by telephone and the availability of in person appointments. I also discussed with the patient that there may be a patient responsible charge related to this service. The patient expressed understanding and agreed to proceed.   I discussed the assessment and treatment plan with the patient. The patient was provided an opportunity to ask questions and all were answered. The patient agreed with the plan and demonstrated an understanding of the instructions.   The patient was advised to call back or seek an in-person evaluation if the symptoms worsen or if the condition fails to improve as anticipated.  I provided 15 minutes of non-face-to-face time during this encounter.  The patient was located at home.  The provider was located at [facility].   Angeline LOISE Sayers, NP   Subjective:   Patient ID:  Kayla Esparza is a 63 y.o. (DOB 09/06/1960) female.  Chief Complaint: No chief complaint on file.   HPI Kayla Esparza presents for follow-up of BPD-2, mixed obsessional thoughts and insomnia.   Describes mood today as ok. Pleasant. Mood symptoms - denies depression. Reports some anxiety and irritability. Reports stable interest and motivation. Denies recent panic attacks. Denies worry, rumination and over thinking. Reports mood as stable. Stating I feel like doing ok. Feels like current medications are helpful. Taking medications as prescribed.   Energy levels stable. Active, does not have a regular exercise routine.  Enjoys some usual interests and activities. Married. Lives with husband. Has a son 60 and step daughter 58. Spending time with family. Appetite  adequate. Weight gain - 220 pounds. Sleeps better some nights than others. Averages 6 hours once getting sleep. Reports some focus and concentration difficulties. Completing tasks. Managing aspects of household. 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  Denies SI or HI.  Denies AH or VH. Denies self harm. Denies substance use. Denies alcohol use.     Past Psychiatric Medication Trials: Cymbalta  Trileptal- Insomnia, HA's Lamictal  Lithium- Took for a long-time and had diarrhea. Mood was probably better. Xanax  Klonopin  Lexapro Prozac Paxil Effexor Wellbutrin Latuda- felt drunk Abilify- allergic reaction Risperdal - Took briefly Ambien- allergic reaction Lunesta - Ineffective Dayvigo  Trazodone - Ineffective, dry mouth, congestion  Review of Systems:  Review of Systems  Musculoskeletal:  Negative for gait problem.  Neurological:  Negative for tremors.  Psychiatric/Behavioral:         Please refer to HPI   Medications: I have reviewed the patient's current medications.  Current Outpatient Medications  Medication Sig Dispense Refill   albuterol (PROVENTIL HFA;VENTOLIN HFA) 108 (90 Base) MCG/ACT inhaler Take 2 puffs every 4 to 6 hour prn wheezing     ALPRAZolam  (XANAX ) 1 MG tablet Take 1/2 to 1 po BID prn as needed for anxiety 30 tablet 2   DULoxetine  (CYMBALTA ) 60 MG capsule Take 1 capsule (60 mg total) by mouth daily. 90 capsule 1   lamoTRIgine  (LAMICTAL ) 200 MG tablet Take 1 tablet (200 mg total) by mouth daily. 90 tablet 1   losartan (COZAAR) 50 MG tablet Take 50 mg by mouth daily.     No current facility-administered medications for this visit.    Medication Side Effects: None  Allergies:  Allergies  Allergen  Reactions   Abilify [Aripiprazole]    Ambien [Zolpidem]    Darvon [Propoxyphene]     Darvocet   Percocet [Oxycodone-Acetaminophen]     Past Medical History:  Diagnosis Date   Elevated cholesterol    Thyroid disease     Family History  Problem Relation Age  of Onset   Depression Father    Depression Sister    Depression Brother    Depression Son     Social History   Socioeconomic History   Marital status: Married    Spouse name: Not on file   Number of children: Not on file   Years of education: Not on file   Highest education level: Not on file  Occupational History   Not on file  Tobacco Use   Smoking status: Former   Smokeless tobacco: Never  Substance and Sexual Activity   Alcohol use: Not on file   Drug use: Not on file   Sexual activity: Not on file  Other Topics Concern   Not on file  Social History Narrative   Not on file   Social Drivers of Health   Financial Resource Strain: Not on file  Food Insecurity: Low Risk  (03/15/2023)   Received from [facility]   Hunger Vital Sign    Within the past 12 months, you worried that your food would run out before you got money to buy more: Never true    Within the past 12 months, the food you bought just didn't last and you didn't have money to get more. : Never true  Transportation Needs: No Transportation Needs (03/15/2023)   Received from [facility]    In the past 12 months, has lack of reliable transportation kept you from medical appointments, meetings, work or from getting things needed for daily living? : No  Physical Activity: Not on file  Stress: Not on file  Social Connections: Not on file  Intimate Partner Violence: Not on file    Past Medical History, Surgical history, Social history, and Family history were reviewed and updated as appropriate.   Please see review of systems for further details on the patient's review from today.   Objective:   Physical Exam:  There were no vitals taken for this visit.  Physical Exam Constitutional:      General: She is not in acute distress. Musculoskeletal:        General: No deformity.  Neurological:     Mental Status: She is alert and oriented to person, place, and time.     Coordination:  Coordination normal.  Psychiatric:        Attention and Perception: Attention and perception normal. She does not perceive auditory or visual hallucinations.        Mood and Affect: Mood normal. Mood is not anxious or depressed. Affect is not labile, blunt, angry or inappropriate.        Speech: Speech normal.        Behavior: Behavior normal.        Thought Content: Thought content normal. Thought content is not paranoid or delusional. Thought content does not include homicidal or suicidal ideation. Thought content does not include homicidal or suicidal plan.        Cognition and Memory: Cognition and memory normal.        Judgment: Judgment normal.     Comments: Insight intact     Lab Review:  No results found for: NA, K, CL, CO2, GLUCOSE, BUN, CREATININE,  CALCIUM, PROT, ALBUMIN, AST, ALT, ALKPHOS, BILITOT, GFRNONAA, GFRAA  No results found for: WBC, RBC, HGB, HCT, PLT, MCV, MCH, MCHC, RDW, LYMPHSABS, MONOABS, EOSABS, BASOSABS  No results found for: POCLITH, LITHIUM   No results found for: PHENYTOIN, PHENOBARB, VALPROATE, CBMZ   .res Assessment: Plan:    Plan:  Duloxetine  60 mg daily for anxiety and mood symptoms.  Xanax  1 mg 1/2-1 tablet daily as needed for anxiety.  Lamictal  200 mg daily for mood symptoms.   Pt to follow-up in 3 months or sooner if clinically indicated.   15 minutes spent dedicated to the care of this patient on the date of this encounter to include pre-visit review of records, ordering of medication, post visit documentation, and face-to-face time with the patient discussing depression, anxiety and OCD. Discussed continuing current medication regimen.  Discussed potential benefits, risk, and side effects of benzodiazepines to include potential risk of tolerance and dependence, as well as possible drowsiness.  Advised patient not to drive if experiencing drowsiness and to take lowest possible  effective dose to minimize risk of dependence and tolerance.   Counseled patient regarding potential benefits, risks, and side effects of Lamictal  to include potential risk of Stevens-Johnson syndrome. Advised patient to stop taking Lamictal  and contact office immediately if rash develops and to seek urgent medical attention if rash is severe and/or spreading quickly.    Patient advised to contact office with any questions, adverse effects, or acute worsening in signs and symptoms.  There are no diagnoses linked to this encounter.   Please see After Visit Summary for patient specific instructions.  Future Appointments  Date Time Provider Department Center  01/11/2024  3:00 PM Stonewall Doss Nattalie, NP CP-[facility] None    No orders of the defined types were placed in this encounter.     -------------------------------

## 2024-03-26 ENCOUNTER — Telehealth: Payer: Self-pay | Admitting: Adult Health

## 2024-03-26 NOTE — Telephone Encounter (Signed)
 Patient lvm at 10:57 stating that she is having trouble getting her presription for Duloxetine  60mg  from pharmacy. States that she has been out for a couple days and has been crying. She is leaving for six weeks and needs prescription sent ASAP. Ph: [phone] Pharmacy [facility] [address] [place], [place]

## 2024-03-26 NOTE — Telephone Encounter (Signed)
 Called patient. A 90-day supply with 1 RF sent on 9/3, too early to fill. Called patient and she said they could fill a 30-day supply today. She should have not been out of medication.

## 2024-06-07 ENCOUNTER — Encounter: Payer: Self-pay | Admitting: Adult Health

## 2024-06-07 ENCOUNTER — Ambulatory Visit: Payer: Self-pay | Admitting: Adult Health

## 2024-06-07 DIAGNOSIS — G4700 Insomnia, unspecified: Secondary | ICD-10-CM

## 2024-06-07 DIAGNOSIS — F3181 Bipolar II disorder: Secondary | ICD-10-CM

## 2024-06-07 DIAGNOSIS — F422 Mixed obsessional thoughts and acts: Secondary | ICD-10-CM

## 2024-06-07 DIAGNOSIS — F5105 Insomnia due to other mental disorder: Secondary | ICD-10-CM

## 2024-06-07 MED ORDER — QUETIAPINE FUMARATE 50 MG PO TABS: 50.0000 mg | ORAL_TABLET | Freq: Every day | ORAL | 2 refills | Status: AC

## 2024-06-07 NOTE — Progress Notes (Signed)
 Kayla Esparza [id number] [birth year] 64 y.o.  Virtual Visit via Telephone Note  I connected with pt on 06/07/24 at  3:00 PM EST by telephone and verified that I am speaking with the correct person using two identifiers.   I discussed the limitations, risks, security and privacy concerns of performing an evaluation and management service by telephone and the availability of in person appointments. I also discussed with the patient that there may be a patient responsible charge related to this service. The patient expressed understanding and agreed to proceed.   I discussed the assessment and treatment plan with the patient. The patient was provided an opportunity to ask questions and all were answered. The patient agreed with the plan and demonstrated an understanding of the instructions.   The patient was advised to call back or seek an in-person evaluation if the symptoms worsen or if the condition fails to improve as anticipated.  I provided 30 minutes of non-face-to-face time during this encounter.  The patient was located at home.  The provider was located at [facility].   Angeline LOISE Sayers, NP   Subjective:   Patient ID:  Kayla Esparza is a 64 y.o. (DOB [birth year]) female.  Chief Complaint: No chief complaint on file.   HPI Kayla Esparza presents for follow-up of BPD-2, mixed obsessional thoughts and insomnia.   Describes mood today as not good. Pleasant. Reports tearfulness. Mood symptoms - reports increased depression over the past month. Reports increased anxiety - it's through the roof. Reports increased irritability - snapping at people - being ugly. Reports lower interest and motivation. Denies recent panic attacks. Reports worry, rumination and over thinking. Reports obsessive thoughts and acts. Reports mood as variable - up and down and all over the place - having mood swings. Stating I'm not doing good. Does not feel like current medications are helpful - willing  to consider other options. Taking medications as prescribed.   Energy levels stable. Active, does not have a regular exercise routine.  Enjoys some usual interests and activities. Married. Lives with husband. Has a son 56 and step daughter 28. Spending time with family. Appetite adequate. Weight gain - 220 pounds. Sleeps well most nights. Averages 8 or more hours. Reports focus and concentration difficulties. Completing tasks. Managing aspects of household. Working part time - seasonal - not currently. Denies SI or HI.  Denies AH or VH. Denies self harm. Denies substance use. Denies alcohol use.     Past Psychiatric Medication Trials: Cymbalta  Trileptal- Insomnia, HA's Lamictal  Lithium- Took for a long-time and had diarrhea. Mood was probably better. Xanax  Klonopin  Lexapro Prozac Paxil Effexor Wellbutrin Latuda- felt drunk Abilify- allergic reaction Risperdal - Took briefly Ambien- allergic reaction Lunesta - Ineffective Dayvigo  Trazodone - Ineffective, dry mouth, congestion  Review of Systems:  Review of Systems  Musculoskeletal:  Negative for gait problem.  Neurological:  Negative for tremors.  Psychiatric/Behavioral:         Please refer to HPI    Medications: I have reviewed the patient's current medications.  Current Outpatient Medications  Medication Sig Dispense Refill   albuterol (PROVENTIL HFA;VENTOLIN HFA) 108 (90 Base) MCG/ACT inhaler Take 2 puffs every 4 to 6 hour prn wheezing     ALPRAZolam  (XANAX ) 1 MG tablet Take 1/2 to 1 po BID prn as needed for anxiety 45 tablet 2   DULoxetine  (CYMBALTA ) 60 MG capsule Take 1 capsule (60 mg total) by mouth daily. 90 capsule 1   lamoTRIgine  (LAMICTAL ) 200 MG tablet Take 1 tablet (200 mg  total) by mouth daily. 90 tablet 1   losartan (COZAAR) 50 MG tablet Take 50 mg by mouth daily.     No current facility-administered medications for this visit.    Medication Side Effects: None  Allergies: Allergies[1]  Past  Medical History:  Diagnosis Date   Elevated cholesterol    Thyroid disease     Family History  Problem Relation Age of Onset   Depression Father    Depression Sister    Depression Brother    Depression Son     Social History   Socioeconomic History   Marital status: Married    Spouse name: Not on file   Number of children: Not on file   Years of education: Not on file   Highest education level: Not on file  Occupational History   Not on file  Tobacco Use   Smoking status: Former   Smokeless tobacco: Never  Substance and Sexual Activity   Alcohol use: Not on file   Drug use: Not on file   Sexual activity: Not on file  Other Topics Concern   Not on file  Social History Narrative   Not on file   Social Drivers of Health   Tobacco Use: Low Risk (06/01/2024)   Received from [facility]   Patient History    Smoking Tobacco Use: Never    Smokeless Tobacco Use: Never    Passive Exposure: Never  Financial Resource Strain: Not on file  Food Insecurity: Low Risk (03/15/2023)   Received from [facility]   Epic    Within the past 12 months, you worried that your food would run out before you got money to buy more: Never true    Within the past 12 months, the food you bought just didn't last and you didn't have money to get more. : Never true  Transportation Needs: No Transportation Needs (03/15/2023)   Received from [facility]    In the past 12 months, has lack of reliable transportation kept you from medical appointments, meetings, work or from getting things needed for daily living? : No  Physical Activity: Not on file  Stress: Not on file  Social Connections: Not on file  Intimate Partner Violence: Not on file  Depression (EYV7-0): Not on file  Alcohol Screen: Not on file  Housing: Low Risk (03/15/2023)   Received from [facility]   Epic    What is your living situation today?: I have a steady place to live    Think about the place you live.  Do you have problems with any of the following? Choose all that apply:: None/None on this list  Utilities: Low Risk (03/15/2023)   Received from [facility]   Utilities    In the past 12 months has the electric, gas, oil, or water company threatened to shut off services in your home? : No  Health Literacy: Not on file    Past Medical History, Surgical history, Social history, and Family history were reviewed and updated as appropriate.   Please see review of systems for further details on the patient's review from today.   Objective:   Physical Exam:  There were no vitals taken for this visit.  Physical Exam  Lab Review:  No results found for: NA, K, CL, CO2, GLUCOSE, BUN, CREATININE, CALCIUM, PROT, ALBUMIN, AST, ALT, ALKPHOS, BILITOT, GFRNONAA, GFRAA  No results found for: WBC, RBC, HGB, HCT, PLT, MCV, MCH, MCHC, RDW, LYMPHSABS, MONOABS, EOSABS, BASOSABS  No  results found for: POCLITH, LITHIUM   No results found for: PHENYTOIN, PHENOBARB, VALPROATE, CBMZ   .res Assessment: Plan:    Plan:  Add: Seroquel  50mg  - take 1/2 tablet at bedtime for 3 nights, then increase to 50mg  at bedtime for mood instability.  Continue: Duloxetine  60 mg daily for anxiety and mood symptoms.  Xanax  1 mg 1/2-1 tablet daily as needed for anxiety.  Lamictal  200 mg daily for mood symptoms.   Pt to follow-up in 4 weeks.  30 minutes spent dedicated to the care of this patient on the date of this encounter to include pre-visit review of records, ordering of medication, post visit documentation, and face-to-face time with the patient discussing depression, anxiety and OCD. Discussed continuing current medication regimen.  Discussed potential benefits, risk, and side effects of benzodiazepines to include potential risk of tolerance and dependence, as well as possible drowsiness.  Advised patient not to drive if experiencing drowsiness  and to take lowest possible effective dose to minimize risk of dependence and tolerance.   Counseled patient regarding potential benefits, risks, and side effects of Lamictal  to include potential risk of Stevens-Johnson syndrome. Advised patient to stop taking Lamictal  and contact office immediately if rash develops and to seek urgent medical attention if rash is severe and/or spreading quickly.    Patient advised to contact office with any questions, adverse effects, or acute worsening in signs and symptoms. There are no diagnoses linked to this encounter.  Please see After Visit Summary for patient specific instructions.  Future Appointments  Date Time Provider Department Center  06/07/2024  3:00 PM Camryn Lampson Nattalie, NP [facility] None    No orders of the defined types were placed in this encounter.     -------------------------------    [1]  Allergies Allergen Reactions   Abilify [Aripiprazole]    Ambien [Zolpidem]    Darvon [Propoxyphene]     Darvocet   Percocet [Oxycodone-Acetaminophen]
